# Patient Record
Sex: Female | Born: 1965
Health system: Southern US, Community
[De-identification: ages and names within clinical notes are randomized; demographics above are authoritative.]

## PROBLEM LIST (undated history)

## (undated) DIAGNOSIS — M549 Dorsalgia, unspecified: Secondary | ICD-10-CM

## (undated) DIAGNOSIS — M1990 Unspecified osteoarthritis, unspecified site: Secondary | ICD-10-CM

## (undated) DIAGNOSIS — F32A Depression, unspecified: Secondary | ICD-10-CM

## (undated) DIAGNOSIS — M479 Spondylosis, unspecified: Secondary | ICD-10-CM

## (undated) DIAGNOSIS — F988 Other specified behavioral and emotional disorders with onset usually occurring in childhood and adolescence: Secondary | ICD-10-CM

## (undated) DIAGNOSIS — I10 Essential (primary) hypertension: Secondary | ICD-10-CM

## (undated) DIAGNOSIS — J189 Pneumonia, unspecified organism: Secondary | ICD-10-CM

## (undated) DIAGNOSIS — F329 Major depressive disorder, single episode, unspecified: Secondary | ICD-10-CM

## (undated) DIAGNOSIS — G8929 Other chronic pain: Secondary | ICD-10-CM

## (undated) DIAGNOSIS — R569 Unspecified convulsions: Secondary | ICD-10-CM

## (undated) HISTORY — PX: APPENDECTOMY: SHX54

## (undated) HISTORY — PX: COLONOSCOPY W/ BIOPSIES AND POLYPECTOMY: SHX1376

## (undated) HISTORY — PX: CATARACT EXTRACTION W/ INTRAOCULAR LENS IMPLANT: SHX1309

## (undated) HISTORY — PX: HEMORRHOID SURGERY: SHX153

## (undated) HISTORY — PX: CHOLECYSTECTOMY: SHX55

## (undated) HISTORY — PX: TUBAL LIGATION: SHX77

---

## 2004-10-22 ENCOUNTER — Ambulatory Visit: Payer: Self-pay | Admitting: Psychiatry

## 2005-04-22 ENCOUNTER — Ambulatory Visit: Payer: Self-pay | Admitting: Psychiatry

## 2005-08-07 ENCOUNTER — Ambulatory Visit: Payer: Self-pay | Admitting: Psychiatry

## 2008-07-21 ENCOUNTER — Ambulatory Visit (HOSPITAL_COMMUNITY): Admission: RE | Admit: 2008-07-21 | Discharge: 2008-07-21 | Payer: Self-pay | Admitting: Family Medicine

## 2010-01-04 ENCOUNTER — Ambulatory Visit (HOSPITAL_COMMUNITY): Admission: RE | Admit: 2010-01-04 | Discharge: 2010-01-04 | Payer: Self-pay | Admitting: Family Medicine

## 2010-02-12 ENCOUNTER — Other Ambulatory Visit
Admission: RE | Admit: 2010-02-12 | Discharge: 2010-02-12 | Payer: Self-pay | Source: Home / Self Care | Admitting: Unknown Physician Specialty

## 2010-02-12 ENCOUNTER — Other Ambulatory Visit: Admission: RE | Admit: 2010-02-12 | Discharge: 2010-02-12 | Payer: Self-pay | Admitting: Unknown Physician Specialty

## 2010-12-02 ENCOUNTER — Encounter: Payer: Self-pay | Admitting: Family Medicine

## 2010-12-23 ENCOUNTER — Encounter: Payer: Self-pay | Admitting: Internal Medicine

## 2010-12-26 ENCOUNTER — Encounter: Payer: Self-pay | Admitting: Internal Medicine

## 2011-01-01 NOTE — Letter (Signed)
Summary: [facility] INSTRUCTIONS  [facility] INSTRUCTIONS   Imported By: Rexene Alberts 12/26/2010 11:03:35  _____________________________________________________________________  External Attachment:    Type:   Image     Comment:   External Document

## 2011-01-01 NOTE — Letter (Signed)
Summary: [facility] TRIAGE  [facility] TRIAGE   Imported By: Rexene Alberts 12/23/2010 11:58:17  _____________________________________________________________________  External Attachment:    Type:   Image     Comment:   External Document  Appended Document: [facility] TRIAGE ok as is  Appended Document: [facility] TRIAGE Instructions mailed.

## 2011-01-06 ENCOUNTER — Ambulatory Visit (HOSPITAL_COMMUNITY)
Admission: RE | Admit: 2011-01-06 | Discharge: 2011-01-06 | Disposition: A | Discharge status: Home / Self Care | Payer: Self-pay | Source: Ambulatory Visit | Attending: Internal Medicine | Admitting: Internal Medicine

## 2011-01-06 ENCOUNTER — Encounter: Payer: Self-pay | Admitting: Internal Medicine

## 2011-01-06 DIAGNOSIS — Z79899 Other long term (current) drug therapy: Secondary | ICD-10-CM | POA: Insufficient documentation

## 2011-01-06 DIAGNOSIS — Z860100 Personal history of colon polyps, unspecified: Secondary | ICD-10-CM | POA: Insufficient documentation

## 2011-01-06 DIAGNOSIS — K5730 Diverticulosis of large intestine without perforation or abscess without bleeding: Secondary | ICD-10-CM

## 2011-01-06 DIAGNOSIS — I10 Essential (primary) hypertension: Secondary | ICD-10-CM | POA: Insufficient documentation

## 2011-01-06 DIAGNOSIS — Z09 Encounter for follow-up examination after completed treatment for conditions other than malignant neoplasm: Secondary | ICD-10-CM

## 2011-01-15 NOTE — Op Note (Addendum)
  NAME:  JESSICAH, CROLL                ACCOUNT NO.:  [id number]  MEDICAL RECORD NO.:  [id number]           PATIENT TYPE:  O  LOCATION:  DAYP                          FACILITY:  [facility]  PHYSICIAN:  R. Roetta Sessions, M.D. DATE OF BIRTH:  07/27/1966  DATE OF PROCEDURE:  01/06/2011 DATE OF DISCHARGE:                              OPERATIVE REPORT   PROCEDURE:  Surveillance colonoscopy.  INDICATIONS FOR PROCEDURE:  A 45 year old lady with a history of colonic polyps.  Last colonoscopy is 5-6 years ago, done with Dr. Cleotis Nipper in [place].  Currently, no lower GI tract symptoms.  Colonoscopy is now being done as surveillance maneuver.  Risks, benefits, limitations, alternatives, imponderables have been reviewed, questions answered. Please see the documentation in the medical record.  PROCEDURE NOTE:  O2 saturation, blood pressure, pulse, respirations were monitored throughout the entire procedure.  CONSCIOUS SEDATION:  Versed 8 mg IV, Demerol 125 mg IV in divided doses.  INSTRUMENT:  Pentax video chip system.  FINDINGS:  Digital rectal exam revealed no abnormalities.  Endoscopic findings:  Prep was good.  Colon:  Colonic mucosa was surveyed from the rectosigmoid junction through the left transverse right colon to the appendiceal orifice, ileocecal valve/cecum.  These structures were well seen and photographed for record.  Terminal ileum was intubated to 5 cm. From this level, scope was slowly and cautiously withdrawn.  All previously mentioned mucosal surfaces were again seen.  The patient did not have sigmoid diverticulum.  Remainder of colonic mucosa and terminal ileal mucosa appeared normal.  Thorough examination of rectal mucosa including retroflexed view of the anal verge demonstrated no abnormalities.  The patient tolerated the procedure well.  Cecal withdrawal time 7 minutes.  IMPRESSION:  Normal rectum, sigmoid diverticulum, remainder colonic mucosa and terminal ileal mucosa  appeared normal.  RECOMMENDATIONS: 1. Diverticulosis literature provided to Ms. Milbrath. 2. Repeat colonoscopy 5 years.     Jonathon Bellows, M.D.     RMR/MEDQ  D:  01/06/2011  T:  01/06/2011  Job:  [id number]  cc:   [facility] Department  Electronically Signed by Lorrin Goodell M.D. on 01/14/2011 02:42:17 PM Electronically Signed by Lorrin Goodell M.D. on 01/14/2011 03:13:17 PM Electronically Signed by Lorrin Goodell M.D. on 01/14/2011 03:37:58 PM Electronically Signed by Lorrin Goodell M.D. on 01/14/2011 04:13:37 PM Electronically Signed by Lorrin Goodell M.D. on 01/14/2011 04:44:57 PM Electronically Signed by Lorrin Goodell M.D. on 01/14/2011 05:18:09 PM Electronically Signed by Lorrin Goodell M.D. on 01/14/2011 05:18:09 PM Electronically Signed by Lorrin Goodell M.D. on 01/14/2011 07:36:58 PM

## 2011-02-18 ENCOUNTER — Other Ambulatory Visit (HOSPITAL_COMMUNITY)
Admission: RE | Admit: 2011-02-18 | Discharge: 2011-02-18 | Disposition: A | Discharge status: Home / Self Care | Payer: PRIVATE HEALTH INSURANCE | Source: Ambulatory Visit | Attending: Nurse Practitioner | Admitting: Nurse Practitioner

## 2011-02-18 ENCOUNTER — Other Ambulatory Visit (HOSPITAL_COMMUNITY)
Admission: RE | Admit: 2011-02-18 | Discharge: 2011-02-18 | Disposition: A | Discharge status: Home / Self Care | Payer: PRIVATE HEALTH INSURANCE | Source: Ambulatory Visit | Attending: Unknown Physician Specialty | Admitting: Unknown Physician Specialty

## 2011-02-18 ENCOUNTER — Other Ambulatory Visit: Payer: Self-pay | Admitting: Nurse Practitioner

## 2011-02-18 DIAGNOSIS — R87612 Low grade squamous intraepithelial lesion on cytologic smear of cervix (LGSIL): Secondary | ICD-10-CM | POA: Insufficient documentation

## 2011-02-18 DIAGNOSIS — R87619 Unspecified abnormal cytological findings in specimens from cervix uteri: Secondary | ICD-10-CM | POA: Insufficient documentation

## 2013-02-08 ENCOUNTER — Emergency Department (HOSPITAL_COMMUNITY)
Admission: EM | Admit: 2013-02-08 | Discharge: 2013-02-09 | Disposition: A | Discharge status: Home / Self Care | Payer: PRIVATE HEALTH INSURANCE | Attending: Emergency Medicine | Admitting: Emergency Medicine

## 2013-02-08 ENCOUNTER — Encounter (HOSPITAL_COMMUNITY): Payer: Self-pay | Admitting: Emergency Medicine

## 2013-02-08 ENCOUNTER — Emergency Department (HOSPITAL_COMMUNITY): Payer: PRIVATE HEALTH INSURANCE

## 2013-02-08 DIAGNOSIS — Z8659 Personal history of other mental and behavioral disorders: Secondary | ICD-10-CM | POA: Insufficient documentation

## 2013-02-08 DIAGNOSIS — M545 Low back pain, unspecified: Secondary | ICD-10-CM | POA: Insufficient documentation

## 2013-02-08 DIAGNOSIS — F17200 Nicotine dependence, unspecified, uncomplicated: Secondary | ICD-10-CM | POA: Insufficient documentation

## 2013-02-08 DIAGNOSIS — M79609 Pain in unspecified limb: Secondary | ICD-10-CM | POA: Insufficient documentation

## 2013-02-08 DIAGNOSIS — R209 Unspecified disturbances of skin sensation: Secondary | ICD-10-CM | POA: Insufficient documentation

## 2013-02-08 DIAGNOSIS — M4310 Spondylolisthesis, site unspecified: Secondary | ICD-10-CM | POA: Insufficient documentation

## 2013-02-08 DIAGNOSIS — I10 Essential (primary) hypertension: Secondary | ICD-10-CM | POA: Insufficient documentation

## 2013-02-08 DIAGNOSIS — M4316 Spondylolisthesis, lumbar region: Secondary | ICD-10-CM

## 2013-02-08 DIAGNOSIS — M549 Dorsalgia, unspecified: Secondary | ICD-10-CM

## 2013-02-08 HISTORY — DX: Depression, unspecified: F32.A

## 2013-02-08 HISTORY — DX: Dorsalgia, unspecified: M54.9

## 2013-02-08 HISTORY — DX: Major depressive disorder, single episode, unspecified: F32.9

## 2013-02-08 HISTORY — DX: Essential (primary) hypertension: I10

## 2013-02-08 MED ORDER — KETOROLAC TROMETHAMINE 60 MG/2ML IM SOLN
60.0000 mg | Freq: Once | INTRAMUSCULAR | Status: AC
  Administered 2013-02-09: 60 mg via INTRAMUSCULAR
  Filled 2013-02-08: qty 2

## 2013-02-08 MED ORDER — CYCLOBENZAPRINE HCL 10 MG PO TABS
10.0000 mg | ORAL_TABLET | Freq: Once | ORAL | Status: DC
  Filled 2013-02-08: qty 1

## 2013-02-08 NOTE — ED Provider Notes (Signed)
History     CSN: [id number]  Arrival date & time 02/08/13  2247   First MD Initiated Contact with Patient 02/08/13 2313      Chief Complaint  Patient presents with  . Back Pain    (Consider location/radiation/quality/duration/timing/severity/associated sxs/prior treatment) HPI Comments: Patient with hx of low back pain for 8 months c/o increasing pain for several days.  Describes a burning, sharp , stinging pain to both legs that radiates from her thighs into her feet.  States she saw her PMD 3 weeks ago for this and was told that it was restless leg syndrome.  She was started on neurontin which she takes three times a day w/o relief.  She states the pain is worse at night and she is unable to sleep.  She also states that she has an appt with a rheumatologist in 2 weeks.  She denies injury, abd pain, shortness of breath, dysuria, incontinence of bowel or bladder, numbness or weakness of the LE's, or saddle anesthesia's.    Patient is a 47 y.o. female presenting with back pain. The history is provided by the patient.  Back Pain Location:  Lumbar spine Quality:  Burning and aching Radiates to:  L posterior upper leg, R posterior upper leg, L knee, R knee, L foot and R foot Pain severity:  Severe Pain is:  Worse during the night Onset quality:  Gradual Duration:  8 months Timing:  Constant Progression:  Worsening Chronicity:  Chronic Context: not emotional stress, not falling, not jumping from heights, not lifting heavy objects, not recent illness, not recent injury and not twisting   Relieved by:  Nothing Worsened by:  Lying down Ineffective treatments:  Ibuprofen (neurontin) Associated symptoms: leg pain and tingling   Associated symptoms: no abdominal pain, no abdominal swelling, no bladder incontinence, no bowel incontinence, no chest pain, no dysuria, no fever, no headaches, no numbness, no paresthesias, no pelvic pain, no perianal numbness and no weakness     Past Medical  History  Diagnosis Date  . Back pain   . Depression   . Hypertension     Past Surgical History  Procedure Laterality Date  . Hemorrhoid surgery      History reviewed. No pertinent family history.  History  Substance Use Topics  . Smoking status: Current Every Day Smoker  . Smokeless tobacco: Not on file  . Alcohol Use: No    OB History   Grav Para Term Preterm Abortions TAB SAB Ect Mult Living                  Review of Systems  Constitutional: Negative for fever, activity change and appetite change.  Respiratory: Negative for cough, chest tightness and shortness of breath.   Cardiovascular: Negative for chest pain.  Gastrointestinal: Negative for vomiting, abdominal pain, constipation, abdominal distention and bowel incontinence.  Genitourinary: Negative for bladder incontinence, dysuria, hematuria, flank pain, decreased urine volume, difficulty urinating and pelvic pain.       No perineal numbness or incontinence of urine or feces  Musculoskeletal: Positive for back pain. Negative for joint swelling and gait problem.  Skin: Negative for rash.  Neurological: Positive for tingling. Negative for dizziness, weakness, numbness, headaches and paresthesias.  All other systems reviewed and are negative.    Allergies  Review of patient's allergies indicates no known allergies.  Home Medications  No current outpatient prescriptions on file.  BP 170/108  Pulse 72  Temp(Src) 98.9 F (37.2 C) (Oral)  Resp 20  Ht 5\' 5"  (1.651 m)  Wt 200 lb (90.719 kg)  BMI 33.28 kg/m2  SpO2 99%  LMP 12/11/2012  Physical Exam  Nursing note and vitals reviewed. Constitutional: She is oriented to person, place, and time. She appears well-developed and well-nourished. No distress.  HENT:  Head: Normocephalic and atraumatic.  Neck: Normal range of motion. Neck supple.  Cardiovascular: Normal rate, regular rhythm, normal heart sounds and intact distal pulses.   No murmur  heard. Pulmonary/Chest: Effort normal and breath sounds normal. No respiratory distress.  Musculoskeletal: She exhibits tenderness. She exhibits no edema.       Lumbar back: She exhibits tenderness and pain. She exhibits normal range of motion, no swelling, no deformity, no laceration and normal pulse.       Back:  ttp of the lumbar spine and paraspinal muscles.  DP pulses are brisk and symmetrical , distal sensation intact.  No extremity edema.    Neurological: She is alert and oriented to person, place, and time. No cranial nerve deficit or sensory deficit. She exhibits normal muscle tone. Coordination and gait normal.  Reflex Scores:      Patellar reflexes are 2+ on the right side and 2+ on the left side.      Achilles reflexes are 2+ on the right side and 2+ on the left side. Skin: Skin is warm and dry.    ED Course  Procedures (including critical care time)  Labs Reviewed - No data to display No results found.   Dg Lumbar Spine Complete  02/09/2013  *RADIOLOGY REPORT*  Clinical Data: Low back pain with radiation into both lower extremities for 8 months.  LUMBAR SPINE - COMPLETE 4+ VIEW  Comparison: None.  Findings: Five lumbar type vertebrae.  Spondylolysis with mild spondylolisthesis at L4-5.  Mild degenerative changes in the upper lumbar interspaces with disc space narrowing and endplate hypertrophic changes.  Degenerative changes in the lumbar facet joints.  No vertebral compression deformities.  No focal bone lesion or bone destruction.  IMPRESSION: Spondylolysis and mild spondylolisthesis of L4-5.   Original Report Authenticated By: Burman Nieves, M.D.      MDM     Patient has ttp of the lumbar spine and paraspinal muscles.  No focal neuro deficits on exam.  Ambulates with a steady gait.  Doubt emergent neurological or infectious process.    On re-eval, pt is feeling better,  I have reviewed the x-ray results with the patient and she agrees to close f/u with her PMD this  week.  I will prescribe norco # 15, flexeril and naprosyn  The patient appears reasonably screened and/or stabilized for discharge and I doubt any other medical condition or other [facility] requiring further screening, evaluation, or treatment in the ED at this time prior to discharge.      Markham Dumlao L. Trisha Mangle, PA-C 02/09/13 1610

## 2013-02-08 NOTE — ED Notes (Signed)
Patient complaining of lower back pain radiating down bilateral legs x 8 months. States pain is worsening.

## 2013-02-09 MED ORDER — CYCLOBENZAPRINE HCL 10 MG PO TABS: 10.0000 mg | ORAL_TABLET | Freq: Three times a day (TID) | ORAL | Status: DC | PRN

## 2013-02-09 MED ORDER — NAPROXEN 500 MG PO TABS: 500.0000 mg | ORAL_TABLET | Freq: Two times a day (BID) | ORAL | Status: DC

## 2013-02-09 MED ORDER — HYDROCODONE-ACETAMINOPHEN 5-325 MG PO TABS: ORAL_TABLET | ORAL | Status: DC

## 2013-02-09 NOTE — ED Provider Notes (Signed)
Medical screening examination/treatment/procedure(s) were performed by non-physician practitioner and as supervising physician I was immediately available for consultation/collaboration.   Fredrik Mogel W Venisa Frampton, MD 02/09/13 0322 

## 2013-03-06 ENCOUNTER — Emergency Department (HOSPITAL_COMMUNITY): Payer: PRIVATE HEALTH INSURANCE

## 2013-03-06 ENCOUNTER — Emergency Department (HOSPITAL_COMMUNITY)
Admission: EM | Admit: 2013-03-06 | Discharge: 2013-03-06 | Disposition: A | Discharge status: Home / Self Care | Payer: PRIVATE HEALTH INSURANCE | Attending: Emergency Medicine | Admitting: Emergency Medicine

## 2013-03-06 ENCOUNTER — Encounter (HOSPITAL_COMMUNITY): Payer: Self-pay

## 2013-03-06 DIAGNOSIS — R059 Cough, unspecified: Secondary | ICD-10-CM | POA: Insufficient documentation

## 2013-03-06 DIAGNOSIS — R197 Diarrhea, unspecified: Secondary | ICD-10-CM | POA: Insufficient documentation

## 2013-03-06 DIAGNOSIS — R05 Cough: Secondary | ICD-10-CM | POA: Insufficient documentation

## 2013-03-06 DIAGNOSIS — J209 Acute bronchitis, unspecified: Secondary | ICD-10-CM | POA: Insufficient documentation

## 2013-03-06 DIAGNOSIS — F3289 Other specified depressive episodes: Secondary | ICD-10-CM | POA: Insufficient documentation

## 2013-03-06 DIAGNOSIS — R091 Pleurisy: Secondary | ICD-10-CM

## 2013-03-06 DIAGNOSIS — G8929 Other chronic pain: Secondary | ICD-10-CM | POA: Insufficient documentation

## 2013-03-06 DIAGNOSIS — M549 Dorsalgia, unspecified: Secondary | ICD-10-CM | POA: Insufficient documentation

## 2013-03-06 DIAGNOSIS — F17200 Nicotine dependence, unspecified, uncomplicated: Secondary | ICD-10-CM | POA: Insufficient documentation

## 2013-03-06 DIAGNOSIS — M25539 Pain in unspecified wrist: Secondary | ICD-10-CM | POA: Insufficient documentation

## 2013-03-06 DIAGNOSIS — F329 Major depressive disorder, single episode, unspecified: Secondary | ICD-10-CM | POA: Insufficient documentation

## 2013-03-06 DIAGNOSIS — I10 Essential (primary) hypertension: Secondary | ICD-10-CM | POA: Insufficient documentation

## 2013-03-06 DIAGNOSIS — J40 Bronchitis, not specified as acute or chronic: Secondary | ICD-10-CM

## 2013-03-06 DIAGNOSIS — R062 Wheezing: Secondary | ICD-10-CM | POA: Insufficient documentation

## 2013-03-06 LAB — CBC WITH DIFFERENTIAL/PLATELET
Basophils Absolute: 0 10*3/uL (ref 0.0–0.1)
Basophils Relative: 0 % (ref 0–1)
Eosinophils Absolute: 0.2 10*3/uL (ref 0.0–0.7)
Eosinophils Relative: 2 % (ref 0–5)
Hemoglobin: 14.2 g/dL (ref 12.0–15.0)
Lymphs Abs: 2.7 10*3/uL (ref 0.7–4.0)
MCHC: 35.5 g/dL (ref 30.0–36.0)
Monocytes Relative: 8 % (ref 3–12)
Neutro Abs: 6.2 10*3/uL (ref 1.7–7.7)

## 2013-03-06 LAB — BASIC METABOLIC PANEL
CO2: 25 mEq/L (ref 19–32)
Chloride: 100 mEq/L (ref 96–112)
GFR calc non Af Amer: >90 mL/min (ref >90)
Potassium: 3.7 mEq/L (ref 3.5–5.1)
Sodium: 135 mEq/L (ref 135–145)

## 2013-03-06 LAB — PRO B NATRIURETIC PEPTIDE: Pro B Natriuretic peptide (BNP): 60.2 pg/mL (ref 0–125)

## 2013-03-06 LAB — PROTIME-INR
INR: 0.92 (ref 0.00–1.49)
Prothrombin Time: 12.3 seconds (ref 11.6–15.2)

## 2013-03-06 LAB — D-DIMER, QUANTITATIVE: D-Dimer, Quant: 0.64 ug/mL-FEU — ABNORMAL HIGH (ref 0.00–0.48)

## 2013-03-06 MED ORDER — ONDANSETRON HCL 4 MG/2ML IJ SOLN
4.0000 mg | Freq: Once | INTRAMUSCULAR | Status: AC
  Administered 2013-03-06: 4 mg via INTRAVENOUS
  Filled 2013-03-06: qty 2

## 2013-03-06 MED ORDER — ASPIRIN 81 MG PO CHEW
324.0000 mg | CHEWABLE_TABLET | Freq: Once | ORAL | Status: AC
Start: 2013-03-06 — End: 2013-03-06
  Administered 2013-03-06: 324 mg via ORAL
  Filled 2013-03-06: qty 4

## 2013-03-06 MED ORDER — IOHEXOL 350 MG/ML SOLN
100.0000 mL | Freq: Once | INTRAVENOUS | Status: AC | PRN
  Administered 2013-03-06: 100 mL via INTRAVENOUS

## 2013-03-06 MED ORDER — LEVOFLOXACIN 750 MG PO TABS: 750.0000 mg | ORAL_TABLET | Freq: Every day | ORAL | Status: DC

## 2013-03-06 MED ORDER — PREDNISONE 50 MG PO TABS
60.0000 mg | ORAL_TABLET | Freq: Once | ORAL | Status: AC
  Administered 2013-03-06: 60 mg via ORAL
  Filled 2013-03-06: qty 1

## 2013-03-06 MED ORDER — KETOROLAC TROMETHAMINE 30 MG/ML IJ SOLN
30.0000 mg | Freq: Once | INTRAMUSCULAR | Status: AC
  Administered 2013-03-06: 30 mg via INTRAVENOUS
  Filled 2013-03-06: qty 1

## 2013-03-06 MED ORDER — ALBUTEROL SULFATE HFA 108 (90 BASE) MCG/ACT IN AERS
2.0000 | INHALATION_SPRAY | Freq: Once | RESPIRATORY_TRACT | Status: AC
  Administered 2013-03-06: 2 via RESPIRATORY_TRACT
  Filled 2013-03-06: qty 6.7

## 2013-03-06 MED ORDER — GI COCKTAIL ~~LOC~~
30.0000 mL | Freq: Once | ORAL | Status: AC
  Administered 2013-03-06: 30 mL via ORAL
  Filled 2013-03-06: qty 30

## 2013-03-06 MED ORDER — LEVOFLOXACIN 750 MG PO TABS
750.0000 mg | ORAL_TABLET | Freq: Once | ORAL | Status: AC
  Administered 2013-03-06: 750 mg via ORAL
  Filled 2013-03-06: qty 1

## 2013-03-06 MED ORDER — ALBUTEROL SULFATE (5 MG/ML) 0.5% IN NEBU
2.5000 mg | INHALATION_SOLUTION | Freq: Once | RESPIRATORY_TRACT | Status: AC
  Administered 2013-03-06: 2.5 mg via RESPIRATORY_TRACT
  Filled 2013-03-06: qty 0.5

## 2013-03-06 MED ORDER — NITROGLYCERIN 0.4 MG SL SUBL
0.4000 mg | SUBLINGUAL_TABLET | SUBLINGUAL | Status: DC | PRN
  Administered 2013-03-06: 0.4 mg via SUBLINGUAL
  Filled 2013-03-06: qty 25

## 2013-03-06 MED ORDER — MORPHINE SULFATE 4 MG/ML IJ SOLN
4.0000 mg | Freq: Once | INTRAMUSCULAR | Status: AC
  Administered 2013-03-06: 4 mg via INTRAVENOUS
  Filled 2013-03-06: qty 1

## 2013-03-06 MED ORDER — PREDNISONE 20 MG PO TABS: ORAL_TABLET | ORAL | Status: DC

## 2013-03-06 NOTE — ED Notes (Signed)
Complain of chest pain that radiates to right shoulder, arm and elbow. States pain is worse with cough

## 2013-03-06 NOTE — ED Provider Notes (Signed)
History    This chart was scribed for Gilda Crease, MD, by Frederik Pear, ED scribe. The patient was seen in room APA19/[facility]19 and the patient's care was started at 1740.    CSN: [id number]  Arrival date & time 03/06/13  1734   First MD Initiated Contact with Patient 03/06/13 1740      No chief complaint on file.   (Consider location/radiation/quality/duration/timing/severity/associated sxs/prior treatment) The history is provided by the patient and medical records. No language interpreter was used.   Jasmine Potter is a 47 y.o. female with a h/o of hypertension, depression and chronic back pain who presents to the Emergency Department complaining of sudden onset, gradually worsening, constant, burning central CP that radiates to her back and down her bilateral arms that is aggravated by coughing and began at 1300. She also complains of constant aching in her bilateral elbows that began after the CP started and wheezing that began at 16:30. She also complains of mild diarrhea that began yesterday, but has since resolved. She denies peripheral edema, fever, nausea, or emesis. She denies any h/o of DVT. She reports a h/o of cardiac disease in her dad at age 68.  Past Medical History  Diagnosis Date  . Back pain   . Depression   . Hypertension     Past Surgical History  Procedure Laterality Date  . Hemorrhoid surgery      No family history on file.  History  Substance Use Topics  . Smoking status: Current Every Day Smoker  . Smokeless tobacco: Not on file  . Alcohol Use: No    OB History   Grav Para Term Preterm Abortions TAB SAB Ect Mult Living                  Review of Systems  Respiratory: Positive for cough and wheezing.   Cardiovascular: Positive for chest pain.  Gastrointestinal: Negative for nausea, vomiting and diarrhea.  Musculoskeletal: Positive for arthralgias (elbows).  All other systems reviewed and are negative.    Allergies  Review of  patient's allergies indicates no known allergies.  Home Medications   Current Outpatient Rx  Name  Route  Sig  Dispense  Refill  . cyclobenzaprine (FLEXERIL) 10 MG tablet   Oral   Take 1 tablet (10 mg total) by mouth 3 (three) times daily as needed.   30 tablet   0   . HYDROcodone-acetaminophen (NORCO/VICODIN) 5-325 MG per tablet      Take one-two tabs po q 4-6 hrs prn pain   15 tablet   0   . naproxen (NAPROSYN) 500 MG tablet   Oral   Take 1 tablet (500 mg total) by mouth 2 (two) times daily with a meal.   20 tablet   0     BP 139/92  Pulse 92  Temp(Src) 98.2 F (36.8 C) (Oral)  Resp 20  Ht 5\' 5"  (1.651 m)  Wt 193 lb (87.544 kg)  BMI 32.12 kg/m2  SpO2 97%  LMP 12/11/2012  Physical Exam  Nursing note and vitals reviewed. Constitutional: She is oriented to person, place, and time. She appears well-developed and well-nourished. No distress.  HENT:  Head: Normocephalic and atraumatic.  Right Ear: Hearing normal.  Nose: Nose normal.  Mouth/Throat: Oropharynx is clear and moist and mucous membranes are normal.  Eyes: Conjunctivae and EOM are normal. Pupils are equal, round, and reactive to light.  Neck: Normal range of motion. Neck supple.  Cardiovascular: Normal rate, regular rhythm,  S1 normal and S2 normal.  Exam reveals no gallop and no friction rub.   No murmur heard. Pulmonary/Chest: Effort normal and breath sounds normal. No respiratory distress. She exhibits no tenderness.  Abdominal: Soft. Normal appearance and bowel sounds are normal. There is no hepatosplenomegaly. There is no tenderness. There is no rebound, no guarding, no tenderness at McBurney's point and negative Murphy's sign. No hernia.  Musculoskeletal: Normal range of motion.  Neurological: She is alert and oriented to person, place, and time. She has normal strength. No cranial nerve deficit or sensory deficit. Coordination normal. GCS eye subscore is 4. GCS verbal subscore is 5. GCS motor subscore  is 6.  Skin: Skin is warm, dry and intact. No rash noted. No cyanosis.  Psychiatric: She has a normal mood and affect. Her speech is normal and behavior is normal. Thought content normal.    ED Course  Procedures (including critical care time)  EKG:  Date: 03/06/2013  Rate: 91  Rhythm: normal sinus rhythm  QRS Axis: normal  Intervals: normal  ST/T Wave abnormalities: nonspecific T wave changes  Conduction Disutrbances:none  Narrative Interpretation:   Old EKG Reviewed: none available  DIAGNOSTIC STUDIES: Oxygen Saturation is 97% on room air, adequate by my interpretation.    COORDINATION OF CARE:  17:40- Discussed planned course of treatment with the patient, including a chest X-ray, chewable aspirin, Nitrostat, blood work, and EKG, who is agreeable at this time.  17:51- Medication Orders- nitroglycerin (nitrostat) SL tablet 0.4 mg- every 5 minutes.  18:00- Medication Orders- aspirin chewable tablet 324 mg- once.  19:13- Ordered CT angio chest.  19:15- Medication Orders- gi cocktail (Maalox, Lidocaine, Donnatal)- once.  20:45- Medication Orders- ketorolac (toradol) 30 mg/ml injection 30 mg- once, ondansetron (zofran) injection 4 mg- once, morphine 4 mg/ml injection 4 mg- once.  21:15- Medication Orders- albuterol (proventil) (5mg /ml) 0.5% nebulizer solution 2.5 mg- once.  Labs Reviewed  BASIC METABOLIC PANEL - Abnormal; Notable for the following:    Glucose, Bld 105 (*)    Creatinine, Ser 0.47 (*)    All other components within normal limits  D-DIMER, QUANTITATIVE - Abnormal; Notable for the following:    D-Dimer, Quant 0.64 (*)    All other components within normal limits  CBC WITH DIFFERENTIAL  TROPONIN I  PROTIME-INR  PRO B NATRIURETIC PEPTIDE   Ct Angio Chest Pe W/cm &/or Wo Cm  03/06/2013  *RADIOLOGY REPORT*  Clinical Data: Chest pain.  Short of breath.  Elevated D-dimer. Pulmonary embolism.  CT ANGIOGRAPHY CHEST  Technique:  Multidetector CT imaging of the  chest using the standard protocol during bolus administration of intravenous contrast. Multiplanar reconstructed images including MIPs were obtained and reviewed to evaluate the vascular anatomy.  Contrast: OMNIPAQUE IOHEXOL 350 MG/ML SOLN  Comparison: None.  Findings: Consider further evaluation with thyroid ultrasound.  If patient is clinically hyperthyroid, consider nuclear medicine thyroid uptake and scan. Bolus dispersion is present on the examination, precluding evaluation of subsegmental vessels in the upper lobes.  There is no axillary adenopathy.  Borderline precarinal lymph node is present.  No pericardial effusion.  Mild intrahepatic biliary ductal dilation is incidentally noted in the upper abdomen, probably secondary to prior cholecystectomy. Clinically correlate.  There is no pulmonary embolism identified. No acute aortic abnormality.  No pericardial or pleural effusion. Trachea appears within normal limits.  There is mural thickening of the right lower lobe bronchi and filling of the posterior basal segment bronchi in the right lower lobe.  Bronchial wall  thickening is also present in the left lower lobe.  Small focus of lingular consolidation is present which may represent atelectasis or scarring.  No aggressive osseous lesions are identified.  Prominent right hilar nodal tissue is probably reactive.  IMPRESSION: 1.  No pulmonary embolus.  Suboptimal evaluation of segmental and smaller pulmonary vessels. 2.  Diffuse lower lobe bronchial wall thickening.  Suspect smoking related lung disease.  Mucoid impaction and right lower lobe segmental airways. 3.  Small focus of collapse / consolidation in the lingula which may represent infection, atelectasis or scarring.  Similar changes in the medial right middle lobe. 4.  Borderline mediastinal and right hilar lymph nodes are probably reactive. 5.  Left thyroid lobe nodule. Consider further evaluation with thyroid ultrasound.  If patient is clinically  hyperthyroid, consider nuclear medicine thyroid uptake and scan.   Original Report Authenticated By: Andreas Newport, M.D.    Dg Chest Port 1 View  03/06/2013  *RADIOLOGY REPORT*  Clinical Data: Chest pain.  Short of breath.  Burning sensation.  PORTABLE CHEST - 1 VIEW  Comparison: None.  Findings: Healed right rib fractures are present.  Fullness of the right hilum of unknown chronicity.  The lung volumes are reduced. There is no airspace disease.  No pleural effusion. Monitoring leads are projected over the chest.  The cardiopericardial silhouette is within normal limits for projection.  Consider repeat PA and lateral chest radiograph with full inspiration when patient condition permits for reevaluation of the right hilum.  IMPRESSION: No acute cardiopulmonary disease.  Fullness of the right hilum. See discussion above.   Original Report Authenticated By: Andreas Newport, M.D.      Diagnosis:1.  Bronchitis with pleurisy, can't rule out lingular pneumonia 2. Thyroid nodule    MDM  She presents to the ER with complaints of pain in the center of her chest. She reports that the pain is a burning pain is significantly worsens when she coughs. She has had progressively worsening cough associated with the symptoms. Chest fever. Patient does family history of heart disease, but it is felt that the symptoms are very atypical. Her EKG was negative. Troponin was negative. This was after 5-6 hours for pain. It is unlikely that this is acute coronary syndrome. She had no improvement of her pain with Pitocin. She has not had any exertional component.  She has persistent cough. ER and has significant pain when she coughs. Chest x-ray did not show obvious abnormality. Patient's d-dimer was slightly elevated and therefore CT angiography was performed. No obvious pulmonary embolus was seen. There is mucoid impaction in the right lower lobe and possible lingular pneumonia. I feel that this explains the patient's current  symptoms. She tells me that she is living with her sister and her sister had similar symptoms last week, and was started on a nebulizer and prednisone. She administered Toradol and morphine with resolution of a burning sensation. She was still having some pain with her cough, however. Patient administered an albuterol nebulizer and had further improvement.   I personally performed the services described in this documentation, which was scribed in my presence. The recorded information has been reviewed and is accurate.       Gilda Crease, MD 03/06/13 2151

## 2013-03-06 NOTE — ED Notes (Signed)
At this time pt states chest pain is unchanged at this time, right sided and states it is a burning pain. Worsened with a deep breath, states she does not want to try another nitro tab and this time.

## 2013-03-06 NOTE — ED Notes (Signed)
Dr Blinda Leatherwood aware that pts pain unchanged with nitro, put in order for GI cocktail

## 2013-03-06 NOTE — ED Notes (Signed)
Pt still states pain present, dr Blinda Leatherwood notified

## 2013-03-06 NOTE — ED Notes (Signed)
Pt states slight relief with GI cocktail

## 2013-03-06 NOTE — ED Notes (Signed)
resp therapy contacted for breathing treatment.

## 2013-03-06 NOTE — ED Notes (Signed)
Pt complain of burning to chest. Request pain medication. EDP aware

## 2013-03-28 ENCOUNTER — Other Ambulatory Visit (HOSPITAL_COMMUNITY): Payer: Self-pay | Admitting: "Endocrinology

## 2013-03-28 DIAGNOSIS — E049 Nontoxic goiter, unspecified: Secondary | ICD-10-CM

## 2013-03-29 ENCOUNTER — Other Ambulatory Visit (HOSPITAL_COMMUNITY): Payer: PRIVATE HEALTH INSURANCE

## 2013-03-29 ENCOUNTER — Ambulatory Visit (HOSPITAL_COMMUNITY): Payer: PRIVATE HEALTH INSURANCE

## 2013-04-05 ENCOUNTER — Ambulatory Visit (HOSPITAL_COMMUNITY)
Admission: RE | Admit: 2013-04-05 | Discharge: 2013-04-05 | Disposition: A | Discharge status: Home / Self Care | Payer: PRIVATE HEALTH INSURANCE | Source: Ambulatory Visit | Attending: "Endocrinology | Admitting: "Endocrinology

## 2013-04-05 DIAGNOSIS — E041 Nontoxic single thyroid nodule: Secondary | ICD-10-CM | POA: Insufficient documentation

## 2013-04-05 DIAGNOSIS — E049 Nontoxic goiter, unspecified: Secondary | ICD-10-CM

## 2013-04-08 ENCOUNTER — Other Ambulatory Visit (HOSPITAL_COMMUNITY): Payer: Self-pay | Admitting: "Endocrinology

## 2013-04-08 DIAGNOSIS — E042 Nontoxic multinodular goiter: Secondary | ICD-10-CM

## 2013-04-14 ENCOUNTER — Ambulatory Visit (HOSPITAL_COMMUNITY)
Admission: RE | Admit: 2013-04-14 | Discharge: 2013-04-14 | Disposition: A | Discharge status: Home / Self Care | Payer: PRIVATE HEALTH INSURANCE | Source: Ambulatory Visit | Attending: "Endocrinology | Admitting: "Endocrinology

## 2013-04-14 ENCOUNTER — Other Ambulatory Visit (HOSPITAL_COMMUNITY): Payer: Self-pay | Admitting: "Endocrinology

## 2013-04-14 DIAGNOSIS — E042 Nontoxic multinodular goiter: Secondary | ICD-10-CM

## 2013-04-14 MED ORDER — LIDOCAINE HCL (PF) 2 % IJ SOLN
INTRAMUSCULAR | Status: AC
  Filled 2013-04-14: qty 10

## 2013-04-14 NOTE — Progress Notes (Signed)
Lidocaine 2%            4mL injected                 left thyroid biopsy performed

## 2013-05-27 ENCOUNTER — Other Ambulatory Visit (HOSPITAL_COMMUNITY): Payer: Self-pay | Admitting: Physician Assistant

## 2013-05-27 DIAGNOSIS — Z139 Encounter for screening, unspecified: Secondary | ICD-10-CM

## 2013-05-31 ENCOUNTER — Inpatient Hospital Stay (HOSPITAL_COMMUNITY): Admission: RE | Admit: 2013-05-31 | Payer: PRIVATE HEALTH INSURANCE | Source: Ambulatory Visit

## 2013-07-19 ENCOUNTER — Ambulatory Visit (HOSPITAL_COMMUNITY): Payer: PRIVATE HEALTH INSURANCE

## 2013-08-11 ENCOUNTER — Ambulatory Visit (HOSPITAL_COMMUNITY)
Admission: RE | Admit: 2013-08-11 | Discharge: 2013-08-11 | Disposition: A | Discharge status: Home / Self Care | Payer: PRIVATE HEALTH INSURANCE | Source: Ambulatory Visit | Attending: Physician Assistant | Admitting: Physician Assistant

## 2013-08-11 DIAGNOSIS — Z139 Encounter for screening, unspecified: Secondary | ICD-10-CM

## 2013-08-11 DIAGNOSIS — Z1231 Encounter for screening mammogram for malignant neoplasm of breast: Secondary | ICD-10-CM | POA: Insufficient documentation

## 2013-09-25 ENCOUNTER — Encounter (HOSPITAL_COMMUNITY): Payer: Self-pay | Admitting: Emergency Medicine

## 2013-09-25 ENCOUNTER — Emergency Department (HOSPITAL_COMMUNITY): Payer: PRIVATE HEALTH INSURANCE

## 2013-09-25 ENCOUNTER — Emergency Department (HOSPITAL_COMMUNITY)
Admission: EM | Admit: 2013-09-25 | Discharge: 2013-09-25 | Disposition: A | Discharge status: Home / Self Care | Payer: PRIVATE HEALTH INSURANCE | Attending: Emergency Medicine | Admitting: Emergency Medicine

## 2013-09-25 DIAGNOSIS — M545 Low back pain, unspecified: Secondary | ICD-10-CM | POA: Insufficient documentation

## 2013-09-25 DIAGNOSIS — F329 Major depressive disorder, single episode, unspecified: Secondary | ICD-10-CM | POA: Insufficient documentation

## 2013-09-25 DIAGNOSIS — F17200 Nicotine dependence, unspecified, uncomplicated: Secondary | ICD-10-CM | POA: Insufficient documentation

## 2013-09-25 DIAGNOSIS — G8929 Other chronic pain: Secondary | ICD-10-CM | POA: Insufficient documentation

## 2013-09-25 DIAGNOSIS — I10 Essential (primary) hypertension: Secondary | ICD-10-CM | POA: Insufficient documentation

## 2013-09-25 DIAGNOSIS — Z79899 Other long term (current) drug therapy: Secondary | ICD-10-CM | POA: Insufficient documentation

## 2013-09-25 DIAGNOSIS — F3289 Other specified depressive episodes: Secondary | ICD-10-CM | POA: Insufficient documentation

## 2013-09-25 DIAGNOSIS — G510 Bell's palsy: Secondary | ICD-10-CM | POA: Insufficient documentation

## 2013-09-25 LAB — COMPREHENSIVE METABOLIC PANEL
ALT: 17 U/L (ref 0–35)
AST: 21 U/L (ref 0–37)
Alkaline Phosphatase: 61 U/L (ref 39–117)
CO2: 26 mEq/L (ref 19–32)
Calcium: 10.1 mg/dL (ref 8.4–10.5)
Chloride: 102 mEq/L (ref 96–112)
GFR calc Af Amer: >90 mL/min (ref >90)
GFR calc non Af Amer: >90 mL/min (ref >90)
Glucose, Bld: 102 mg/dL — ABNORMAL HIGH (ref 70–99)
Sodium: 137 mEq/L (ref 135–145)
Total Bilirubin: 0.3 mg/dL (ref 0.3–1.2)

## 2013-09-25 LAB — CBC WITH DIFFERENTIAL/PLATELET
Basophils Absolute: 0 10*3/uL (ref 0.0–0.1)
Eosinophils Relative: 1 % (ref 0–5)
HCT: 40.6 % (ref 36.0–46.0)
Lymphocytes Relative: 44 % (ref 12–46)
Lymphs Abs: 3.9 10*3/uL (ref 0.7–4.0)
MCV: 91 fL (ref 78.0–100.0)
Neutro Abs: 4.2 10*3/uL (ref 1.7–7.7)
Platelets: 259 10*3/uL (ref 150–400)
RBC: 4.46 MIL/uL (ref 3.87–5.11)
WBC: 8.9 10*3/uL (ref 4.0–10.5)

## 2013-09-25 MED ORDER — VALACYCLOVIR HCL 1 G PO TABS: 1000.0000 mg | ORAL_TABLET | Freq: Three times a day (TID) | ORAL | Status: DC

## 2013-09-25 MED ORDER — ARTIFICIAL TEARS OP OINT: TOPICAL_OINTMENT | OPHTHALMIC | Status: DC | PRN

## 2013-09-25 MED ORDER — PREDNISONE 20 MG PO TABS: 60.0000 mg | ORAL_TABLET | Freq: Every day | ORAL | Status: DC

## 2013-09-25 MED ORDER — PREDNISONE 50 MG PO TABS
60.0000 mg | ORAL_TABLET | Freq: Once | ORAL | Status: AC
  Administered 2013-09-25: 60 mg via ORAL
  Filled 2013-09-25 (×2): qty 1

## 2013-09-25 MED ORDER — IOHEXOL 350 MG/ML SOLN
100.0000 mL | Freq: Once | INTRAVENOUS | Status: AC | PRN
  Administered 2013-09-25: 100 mL via INTRAVENOUS

## 2013-09-25 MED ORDER — VALACYCLOVIR HCL 500 MG PO TABS
1000.0000 mg | ORAL_TABLET | Freq: Once | ORAL | Status: AC
  Administered 2013-09-25: 1000 mg via ORAL
  Filled 2013-09-25: qty 2

## 2013-09-25 NOTE — ED Notes (Signed)
Pt reports pain in left side of neck and left side of face.  Says feels like has no control of muscles in left side of face.  No extremity numbness or weakness.  Left sided facial droop as well.

## 2013-09-25 NOTE — ED Notes (Signed)
Facial palsy noted to left side. Patient unable to completely close left eye. Patient with not other deficits.

## 2013-09-25 NOTE — ED Provider Notes (Signed)
CSN: [id number]     Arrival date & time 09/25/13  1333 History  This chart was scribed for Glynn Octave, MD by Bennett Scrape, ED Scribe. This patient was seen in room APA12/APA12 and the patient's care was started at 1:55 PM.   Chief Complaint  Patient presents with  . Facial Pain    The history is provided by the patient. No language interpreter was used.    HPI Comments: Jasmine Potter is a 47 y.o. female who presents to the Emergency Department complaining of weakness to the left face described as feeling like she has no control over the left facial muscles with an associated inability to completely close the left eye and a left-sided facial droop. Pt states that she had several episodes of brief left-sided neck pains that radiated into the face for the past 2 days. She states that this morning she noted the weakness with a left sided facial droop. She denies having any facial pain currently. She denies any weakness or numbness in her extremities, CP or SOB. She denies any aphasia or troubles swallowing. She denies any recent camping trips or known tick bites. She also reports a h/o DDD with chronic lower back pain that she states has been radiating into her posterior left thigh. She states that she plans to f/u with her PCP for the symptoms. She denies having a h/o DM. Denies any tick bites  PCP is Dr. Loreta Ave  Past Medical History  Diagnosis Date  . Back pain   . Depression   . Hypertension    Past Surgical History  Procedure Laterality Date  . Hemorrhoid surgery     No family history on file. History  Substance Use Topics  . Smoking status: Current Every Day Smoker  . Smokeless tobacco: Not on file  . Alcohol Use: No   No OB history provided.  Review of Systems  A complete 10 system review of systems was obtained and all systems are negative except as noted in the HPI and PMH.   Allergies  Review of patient's allergies indicates no known allergies.  Home Medications    Current Outpatient Rx  Name  Route  Sig  Dispense  Refill  . Ascorbic Acid (VITAMIN C PO)   Oral   Take 1 tablet by mouth daily.         Marland Kitchen docusate sodium (COLACE) 100 MG capsule   Oral   Take 100 mg by mouth daily as needed for mild constipation.         . fish oil-omega-3 fatty acids 1000 MG capsule   Oral   Take 2 g by mouth 2 (two) times daily.         Marland Kitchen FLUoxetine (PROZAC) 20 MG capsule   Oral   Take 60 mg by mouth daily.          Marland Kitchen oxyCODONE-acetaminophen (PERCOCET) 10-325 MG per tablet   Oral   Take 1 tablet by mouth every 4 (four) hours as needed for pain.         . phentermine 37.5 MG capsule   Oral   Take 37.5 mg by mouth daily.         Marland Kitchen artificial tears (LACRILUBE) OINT ophthalmic ointment   Both Eyes   Place into both eyes every 4 (four) hours as needed for dry eyes. Apply to l eye as needed   3.5 g   0   . predniSONE (DELTASONE) 20 MG tablet   Oral  Take 3 tablets (60 mg total) by mouth daily with breakfast.   21 tablet   0   . valACYclovir (VALTREX) 1000 MG tablet   Oral   Take 1 tablet (1,000 mg total) by mouth 3 (three) times daily.   21 tablet   0    Triage Vitals: BP 179/98  Pulse 96  Temp(Src) 98.2 F (36.8 C) (Oral)  Resp 18  Ht 5\' 5"  (1.651 m)  Wt 210 lb (95.255 kg)  BMI 34.95 kg/m2  SpO2 100%  Physical Exam  Nursing note and vitals reviewed. Constitutional: She is oriented to person, place, and time. She appears well-developed and well-nourished. No distress.  HENT:  Head: Normocephalic and atraumatic.  Mouth/Throat: Oropharynx is clear and moist. No oropharyngeal exudate.  Left molar is decayed and non-tender to palpation.  No drainable abscess.  Eyes: Conjunctivae and EOM are normal. Pupils are equal, round, and reactive to light.  Neck: Normal range of motion. Neck supple. No tracheal deviation present.  No carotid bruits  Cardiovascular: Normal rate and regular rhythm.   Pulmonary/Chest: Effort normal and  breath sounds normal. No respiratory distress.  Abdominal: Soft. There is no tenderness. There is no rebound and no guarding.  Musculoskeletal: Normal range of motion.  Neurological: She is alert and oriented to person, place, and time. A cranial nerve deficit is present. She exhibits normal muscle tone. Coordination normal.  L sided facial droop, no ataxia on finger to nose, no nystagmus, 5/5 strength throughout, no pronator drift, Romberg negative, normal gait.  Left-sided facial droop. Asymmetric smile. Unable to close left eye completely. Unable to raise left forehead. Tongue is midline.    Skin: Skin is warm and dry.  Psychiatric: She has a normal mood and affect. Her behavior is normal.    ED Course  Procedures (including critical care time)  Medications  predniSONE (DELTASONE) tablet 60 mg (60 mg Oral Given 09/25/13 1410)  valACYclovir (VALTREX) tablet 1,000 mg (1,000 mg Oral Given 09/25/13 1410)  iohexol (OMNIPAQUE) 350 MG/ML injection 100 mL (100 mLs Intravenous Contrast Given 09/25/13 1619)    DIAGNOSTIC STUDIES: Oxygen Saturation is 100% on room air, normal by my interpretation.    COORDINATION OF CARE: 1:59 PM-Advised pt that she most likely has Bell's Palsy which can be treated with antivirals. Discussed treatment plan which includes CT of head to rule out an infarct with pt at bedside and pt agreed to plan. Pt declined   2:23 PM-Pt rechecked and now reports neck pain. She is still unable to completely close the left eye. Still declines pain medication.  Labs Review Labs Reviewed  COMPREHENSIVE METABOLIC PANEL - Abnormal; Notable for the following:    Glucose, Bld 102 (*)    All other components within normal limits  CBC WITH DIFFERENTIAL   Imaging Review Ct Head Wo Contrast   09/25/2013   CLINICAL DATA:  Left-sided facial drooping this morning.  EXAM: CT HEAD WITHOUT CONTRAST  TECHNIQUE: Contiguous axial images were obtained from the base of the skull through the  vertex without intravenous contrast.  COMPARISON:  None.  FINDINGS: Ventricles are normal in size and configuration. There are no parenchymal masses or mass effect. There are no areas of abnormal parenchymal attenuation. There is no evidence of an infarct. No extra-axial masses or abnormal fluid collections.  No intracranial hemorrhage.  Visualized sinuses and mastoid air cells are clear.  IMPRESSION: Normal unenhanced CT scan of the brain.   Electronically Signed   By: Amie Portland  M.D.   On: 09/25/2013 14:20    EKG Interpretation   None       MDM   1. Bell's palsy    Left-sided facial weakness since this morning. She was last seen normal last night. Denies any difficulty speaking, swallowing, breathing. No extremity weakness or numbness.  Exam appears to be consistent with Bell's palsy. Unable to close left eye. Unable to raise left eyebrow. Drooping of left corner of mouth. Remainder of neurological exam is normal. Doubt CVA.  This patient was describing some intermittent pains in the side of her neck we'll obtain CT angiogram to evaluate remote possibility of carotid dissection.  CT angiogram of her neck and head is negative. We'll treat for Bell's palsy with steroids, valtrex, and eye lubrication. Followup with her Dr. this week. Return precautions discussed.  I personally performed the services described in this documentation, which was scribed in my presence. The recorded information has been reviewed and is accurate.     Glynn Octave, MD 09/25/13 [id number]

## 2013-10-20 ENCOUNTER — Ambulatory Visit (HOSPITAL_COMMUNITY)
Admission: RE | Admit: 2013-10-20 | Discharge: 2013-10-20 | Disposition: A | Discharge status: Home / Self Care | Payer: PRIVATE HEALTH INSURANCE | Source: Ambulatory Visit | Attending: Family Medicine | Admitting: Family Medicine

## 2013-10-20 ENCOUNTER — Other Ambulatory Visit (HOSPITAL_COMMUNITY): Payer: Self-pay | Admitting: Family Medicine

## 2013-10-20 DIAGNOSIS — R109 Unspecified abdominal pain: Secondary | ICD-10-CM | POA: Insufficient documentation

## 2013-10-20 DIAGNOSIS — M479 Spondylosis, unspecified: Secondary | ICD-10-CM | POA: Insufficient documentation

## 2013-10-20 DIAGNOSIS — K429 Umbilical hernia without obstruction or gangrene: Secondary | ICD-10-CM | POA: Insufficient documentation

## 2013-10-20 DIAGNOSIS — R300 Dysuria: Secondary | ICD-10-CM | POA: Insufficient documentation

## 2013-10-20 DIAGNOSIS — K838 Other specified diseases of biliary tract: Secondary | ICD-10-CM | POA: Insufficient documentation

## 2013-10-20 DIAGNOSIS — M549 Dorsalgia, unspecified: Secondary | ICD-10-CM | POA: Insufficient documentation

## 2013-10-20 DIAGNOSIS — I10 Essential (primary) hypertension: Secondary | ICD-10-CM | POA: Insufficient documentation

## 2013-10-20 MED ORDER — IOHEXOL 300 MG/ML  SOLN
100.0000 mL | Freq: Once | INTRAMUSCULAR | Status: AC | PRN
  Administered 2013-10-20: 100 mL via INTRAVENOUS

## 2014-08-01 ENCOUNTER — Other Ambulatory Visit (HOSPITAL_COMMUNITY): Payer: Self-pay | Admitting: Physician Assistant

## 2014-08-01 DIAGNOSIS — Z139 Encounter for screening, unspecified: Secondary | ICD-10-CM

## 2014-08-04 ENCOUNTER — Ambulatory Visit (HOSPITAL_COMMUNITY): Payer: PRIVATE HEALTH INSURANCE

## 2015-01-04 ENCOUNTER — Other Ambulatory Visit (HOSPITAL_COMMUNITY): Payer: Self-pay | Admitting: Family Medicine

## 2015-01-04 DIAGNOSIS — Z139 Encounter for screening, unspecified: Secondary | ICD-10-CM

## 2015-01-08 ENCOUNTER — Ambulatory Visit (HOSPITAL_COMMUNITY): Payer: PRIVATE HEALTH INSURANCE

## 2015-01-10 ENCOUNTER — Other Ambulatory Visit (HOSPITAL_COMMUNITY): Payer: Self-pay | Admitting: Physician Assistant

## 2015-01-10 DIAGNOSIS — N6320 Unspecified lump in the left breast, unspecified quadrant: Secondary | ICD-10-CM

## 2015-01-23 ENCOUNTER — Ambulatory Visit (HOSPITAL_COMMUNITY)
Admission: RE | Admit: 2015-01-23 | Discharge: 2015-01-23 | Disposition: A | Discharge status: Home / Self Care | Payer: PRIVATE HEALTH INSURANCE | Source: Ambulatory Visit | Attending: Physician Assistant | Admitting: Physician Assistant

## 2015-01-23 ENCOUNTER — Other Ambulatory Visit (HOSPITAL_COMMUNITY): Payer: Self-pay | Admitting: Physician Assistant

## 2015-01-23 DIAGNOSIS — Z1231 Encounter for screening mammogram for malignant neoplasm of breast: Secondary | ICD-10-CM | POA: Insufficient documentation

## 2015-01-23 DIAGNOSIS — Z1239 Encounter for other screening for malignant neoplasm of breast: Secondary | ICD-10-CM

## 2015-01-23 DIAGNOSIS — N63 Unspecified lump in breast: Secondary | ICD-10-CM | POA: Insufficient documentation

## 2015-01-23 DIAGNOSIS — N6320 Unspecified lump in the left breast, unspecified quadrant: Secondary | ICD-10-CM

## 2015-01-24 ENCOUNTER — Telehealth: Payer: Self-pay | Admitting: General Practice

## 2015-01-24 NOTE — Telephone Encounter (Signed)
LMOM to call.

## 2015-01-24 NOTE — Telephone Encounter (Signed)
Please call patient to schedule her tcs. She can be reached at [phone] after 2 pm.  Routing to [facility]

## 2015-01-31 NOTE — Telephone Encounter (Signed)
I called pt. She had her last colonoscopy in 12/21/2014 by Dr. Gala Romney and he recommended next in 5 years. She is on recall for 12/2015. She has a hx of colonic polyps.  She is not having any problems. She does have a family hx of colon cancer in her paternal grandfather. She would still be due in 5 years. I am routing to Laban Emperor, NP to advise if any different.

## 2015-02-01 NOTE — Telephone Encounter (Signed)
Last colonoscopy 2012. Yes, next in 2017. Agree.

## 2015-02-01 NOTE — Telephone Encounter (Signed)
Called and LMOM that she is not due til 12/2015. If she has problems before then, please call.  We will have her on recall for 12/2015. Letter sent to PCP.  Stacy, please nic for 12/2015.

## 2015-02-26 ENCOUNTER — Ambulatory Visit: Payer: PRIVATE HEALTH INSURANCE | Admitting: Nurse Practitioner

## 2015-12-03 ENCOUNTER — Encounter: Payer: Self-pay | Admitting: Internal Medicine

## 2015-12-18 ENCOUNTER — Telehealth: Payer: Self-pay

## 2015-12-18 NOTE — Telephone Encounter (Signed)
[phone]  PATIENT RECEIVED LETTER TO SCHEDULE TCS

## 2015-12-18 NOTE — Telephone Encounter (Signed)
Pt was on recall for colonoscopy. Her last one in 2012 and next now due. She has a hx of colon polyps.  Ov with Walden Field, NP on 01/01/2016 at 2:00 PM.

## 2016-01-01 ENCOUNTER — Ambulatory Visit: Payer: PRIVATE HEALTH INSURANCE | Admitting: Nurse Practitioner

## 2016-01-01 ENCOUNTER — Encounter: Payer: Self-pay | Admitting: Nurse Practitioner

## 2016-01-01 ENCOUNTER — Telehealth: Payer: Self-pay | Admitting: Nurse Practitioner

## 2016-01-01 NOTE — Telephone Encounter (Signed)
Noted  

## 2016-01-01 NOTE — Telephone Encounter (Signed)
PATIENT WAS A NO SHOW AND LETTER SENT  °

## 2016-01-28 ENCOUNTER — Ambulatory Visit (HOSPITAL_COMMUNITY)
Admission: RE | Admit: 2016-01-28 | Discharge: 2016-01-28 | Disposition: A | Discharge status: Home / Self Care | Payer: PRIVATE HEALTH INSURANCE | Source: Ambulatory Visit | Attending: Physician Assistant | Admitting: Physician Assistant

## 2016-01-28 ENCOUNTER — Other Ambulatory Visit (HOSPITAL_COMMUNITY): Payer: Self-pay | Admitting: Physician Assistant

## 2016-01-28 DIAGNOSIS — M25561 Pain in right knee: Secondary | ICD-10-CM | POA: Diagnosis present

## 2016-01-28 DIAGNOSIS — G894 Chronic pain syndrome: Secondary | ICD-10-CM

## 2016-02-25 ENCOUNTER — Telehealth: Payer: Self-pay

## 2016-02-25 NOTE — Telephone Encounter (Signed)
Pt called to set up colonoscopy. I told her the triage nurse does triages between 130-430, She can be reached at [phone]

## 2016-02-27 ENCOUNTER — Encounter: Payer: Self-pay | Admitting: Nurse Practitioner

## 2016-02-27 NOTE — Telephone Encounter (Signed)
Pt missed her appointment with Walden Field, NP on 01/01/2016. She has a hx of polyps and was supposed to come in for an OV prior to scheduling a colonoscopy.  Can you call and schedule the OV appt for her Jasmine Potter?  Thanks!

## 2016-02-27 NOTE — Telephone Encounter (Signed)
OV made and letter mailed °

## 2016-03-03 NOTE — Telephone Encounter (Signed)
Pt called and is aware of the OV appt on 03/25/2016 at 10:00 AM with Walden Field, NP.

## 2016-03-14 ENCOUNTER — Ambulatory Visit (HOSPITAL_COMMUNITY)
Admission: RE | Admit: 2016-03-14 | Discharge: 2016-03-14 | Disposition: A | Discharge status: Home / Self Care | Payer: PRIVATE HEALTH INSURANCE | Source: Ambulatory Visit | Attending: Physician Assistant | Admitting: Physician Assistant

## 2016-03-14 ENCOUNTER — Other Ambulatory Visit (HOSPITAL_COMMUNITY): Payer: Self-pay | Admitting: Physician Assistant

## 2016-03-14 DIAGNOSIS — J9811 Atelectasis: Secondary | ICD-10-CM | POA: Insufficient documentation

## 2016-03-14 DIAGNOSIS — M5136 Other intervertebral disc degeneration, lumbar region: Secondary | ICD-10-CM | POA: Insufficient documentation

## 2016-03-14 DIAGNOSIS — M4316 Spondylolisthesis, lumbar region: Secondary | ICD-10-CM | POA: Insufficient documentation

## 2016-03-14 DIAGNOSIS — M4306 Spondylolysis, lumbar region: Secondary | ICD-10-CM | POA: Diagnosis present

## 2016-03-17 ENCOUNTER — Other Ambulatory Visit (HOSPITAL_COMMUNITY): Payer: Self-pay | Admitting: Physician Assistant

## 2016-03-17 ENCOUNTER — Ambulatory Visit (HOSPITAL_COMMUNITY)
Admission: RE | Admit: 2016-03-17 | Discharge: 2016-03-17 | Disposition: A | Discharge status: Home / Self Care | Payer: PRIVATE HEALTH INSURANCE | Source: Ambulatory Visit | Attending: Physician Assistant | Admitting: Physician Assistant

## 2016-03-17 DIAGNOSIS — M4316 Spondylolisthesis, lumbar region: Secondary | ICD-10-CM | POA: Diagnosis present

## 2016-03-17 DIAGNOSIS — M5136 Other intervertebral disc degeneration, lumbar region: Secondary | ICD-10-CM | POA: Diagnosis present

## 2016-03-17 DIAGNOSIS — M4306 Spondylolysis, lumbar region: Secondary | ICD-10-CM | POA: Diagnosis present

## 2016-03-25 ENCOUNTER — Ambulatory Visit: Payer: PRIVATE HEALTH INSURANCE | Admitting: Nurse Practitioner

## 2016-04-10 ENCOUNTER — Telehealth: Payer: Self-pay | Admitting: Gastroenterology

## 2016-04-10 ENCOUNTER — Encounter: Payer: Self-pay | Admitting: Gastroenterology

## 2016-04-10 ENCOUNTER — Ambulatory Visit: Payer: PRIVATE HEALTH INSURANCE | Admitting: Gastroenterology

## 2016-04-10 NOTE — Telephone Encounter (Signed)
PT WAS A NO SHOW AND LETTER SENT  °

## 2016-12-08 DIAGNOSIS — G894 Chronic pain syndrome: Secondary | ICD-10-CM | POA: Diagnosis not present

## 2016-12-08 DIAGNOSIS — E669 Obesity, unspecified: Secondary | ICD-10-CM | POA: Diagnosis not present

## 2016-12-08 DIAGNOSIS — Z6835 Body mass index (BMI) 35.0-35.9, adult: Secondary | ICD-10-CM | POA: Diagnosis not present

## 2016-12-08 DIAGNOSIS — Z23 Encounter for immunization: Secondary | ICD-10-CM | POA: Diagnosis not present

## 2016-12-08 DIAGNOSIS — F909 Attention-deficit hyperactivity disorder, unspecified type: Secondary | ICD-10-CM | POA: Diagnosis not present

## 2016-12-08 DIAGNOSIS — Z1389 Encounter for screening for other disorder: Secondary | ICD-10-CM | POA: Diagnosis not present

## 2016-12-18 ENCOUNTER — Telehealth: Payer: Self-pay

## 2016-12-18 NOTE — Telephone Encounter (Signed)
Pt referred for colonoscopy. OV due to med list. OV with Neil Crouch, PA on 01/06/2017 at 11:30 Am.

## 2017-01-01 DIAGNOSIS — Z1231 Encounter for screening mammogram for malignant neoplasm of breast: Secondary | ICD-10-CM | POA: Diagnosis not present

## 2017-01-06 ENCOUNTER — Encounter: Payer: Self-pay | Admitting: Gastroenterology

## 2017-01-06 ENCOUNTER — Telehealth: Payer: Self-pay | Admitting: Gastroenterology

## 2017-01-06 ENCOUNTER — Ambulatory Visit: Payer: PRIVATE HEALTH INSURANCE | Admitting: Gastroenterology

## 2017-01-06 NOTE — Telephone Encounter (Signed)
PT WAS A NO SHOW AND LETTER SENT  °

## 2017-02-03 DIAGNOSIS — H2511 Age-related nuclear cataract, right eye: Secondary | ICD-10-CM | POA: Diagnosis not present

## 2017-02-03 DIAGNOSIS — H04123 Dry eye syndrome of bilateral lacrimal glands: Secondary | ICD-10-CM | POA: Diagnosis not present

## 2017-02-03 DIAGNOSIS — H25812 Combined forms of age-related cataract, left eye: Secondary | ICD-10-CM | POA: Diagnosis not present

## 2017-02-20 ENCOUNTER — Telehealth: Payer: Self-pay | Admitting: Gastroenterology

## 2017-02-20 ENCOUNTER — Encounter: Payer: Self-pay | Admitting: General Practice

## 2017-02-20 ENCOUNTER — Ambulatory Visit: Payer: PRIVATE HEALTH INSURANCE | Admitting: Gastroenterology

## 2017-02-20 ENCOUNTER — Encounter: Payer: Self-pay | Admitting: Gastroenterology

## 2017-02-20 NOTE — Telephone Encounter (Signed)
PT WAS A NO SHOW AND LETTER SENT  °

## 2017-02-20 NOTE — Telephone Encounter (Signed)
PATIENT WAS A NO SHOW X 3, HAVE NOT MAILED LETTER

## 2017-02-20 NOTE — Telephone Encounter (Signed)
Discharge

## 2017-02-20 NOTE — Telephone Encounter (Signed)
Routing to Dr. Gala Romney for the approval to discharge the patient from the practice

## 2017-02-20 NOTE — Telephone Encounter (Signed)
Discharge letter mailed  

## 2017-03-03 DIAGNOSIS — Z1389 Encounter for screening for other disorder: Secondary | ICD-10-CM | POA: Diagnosis not present

## 2017-03-03 DIAGNOSIS — M47816 Spondylosis without myelopathy or radiculopathy, lumbar region: Secondary | ICD-10-CM | POA: Diagnosis not present

## 2017-03-03 DIAGNOSIS — M2550 Pain in unspecified joint: Secondary | ICD-10-CM | POA: Diagnosis not present

## 2017-03-03 DIAGNOSIS — Z6831 Body mass index (BMI) 31.0-31.9, adult: Secondary | ICD-10-CM | POA: Diagnosis not present

## 2017-03-03 DIAGNOSIS — F909 Attention-deficit hyperactivity disorder, unspecified type: Secondary | ICD-10-CM | POA: Diagnosis not present

## 2017-03-17 NOTE — Telephone Encounter (Signed)
Discharge letter was returned undeliverable. I have re-sent the letter via [employer] and made a copy of the envelope and sent it to be scanned.

## 2017-05-27 DIAGNOSIS — M1991 Primary osteoarthritis, unspecified site: Secondary | ICD-10-CM | POA: Diagnosis not present

## 2017-05-27 DIAGNOSIS — E669 Obesity, unspecified: Secondary | ICD-10-CM | POA: Diagnosis not present

## 2017-05-27 DIAGNOSIS — Z6831 Body mass index (BMI) 31.0-31.9, adult: Secondary | ICD-10-CM | POA: Diagnosis not present

## 2017-05-27 DIAGNOSIS — F419 Anxiety disorder, unspecified: Secondary | ICD-10-CM | POA: Diagnosis not present

## 2017-05-27 DIAGNOSIS — F329 Major depressive disorder, single episode, unspecified: Secondary | ICD-10-CM | POA: Diagnosis not present

## 2017-05-27 DIAGNOSIS — Z1389 Encounter for screening for other disorder: Secondary | ICD-10-CM | POA: Diagnosis not present

## 2017-05-27 DIAGNOSIS — J340 Abscess, furuncle and carbuncle of nose: Secondary | ICD-10-CM | POA: Diagnosis not present

## 2017-06-02 DIAGNOSIS — H25812 Combined forms of age-related cataract, left eye: Secondary | ICD-10-CM | POA: Diagnosis not present

## 2017-06-02 DIAGNOSIS — H2511 Age-related nuclear cataract, right eye: Secondary | ICD-10-CM | POA: Diagnosis not present

## 2017-06-02 DIAGNOSIS — H04123 Dry eye syndrome of bilateral lacrimal glands: Secondary | ICD-10-CM | POA: Diagnosis not present

## 2017-06-17 DIAGNOSIS — H25812 Combined forms of age-related cataract, left eye: Secondary | ICD-10-CM | POA: Diagnosis not present

## 2017-06-17 DIAGNOSIS — H2512 Age-related nuclear cataract, left eye: Secondary | ICD-10-CM | POA: Diagnosis not present

## 2017-07-03 DIAGNOSIS — T8522XA Displacement of intraocular lens, initial encounter: Secondary | ICD-10-CM | POA: Diagnosis not present

## 2017-07-03 DIAGNOSIS — H25811 Combined forms of age-related cataract, right eye: Secondary | ICD-10-CM | POA: Diagnosis not present

## 2017-07-03 DIAGNOSIS — H4302 Vitreous prolapse, left eye: Secondary | ICD-10-CM | POA: Diagnosis not present

## 2017-07-03 DIAGNOSIS — H33322 Round hole, left eye: Secondary | ICD-10-CM | POA: Diagnosis not present

## 2017-07-03 DIAGNOSIS — H53132 Sudden visual loss, left eye: Secondary | ICD-10-CM | POA: Diagnosis not present

## 2017-07-15 ENCOUNTER — Ambulatory Visit: Payer: Self-pay | Admitting: Ophthalmology

## 2017-07-15 ENCOUNTER — Encounter (HOSPITAL_COMMUNITY): Payer: Self-pay | Admitting: *Deleted

## 2017-07-15 NOTE — H&P (Signed)
Date of examination:  07/03/17  Indication for surgery: subluxed/dislocated intraocular lens left eye  Pertinent past medical history:  Past Medical History:  Diagnosis Date  . Back pain   . Depression   . Hypertension     Pertinent ocular history: Cataract surgery  Pertinent family history: No family history on file.  General:  Healthy appearing patient in no distress.    Eyes:    Acuity OD 20/50-2  OS 20/80-2  [place]  External: Within normal limits     Anterior segment: subluxed/dislocated IOL OS    Fundus: Normal      Impression: subluxed/dislocated IOL left eye  Plan: Pars plana vitrectomy, anterior vitectomy, suture IOL left eye  Royston Cowper

## 2017-07-15 NOTE — Progress Notes (Signed)
Pt denies SOB, chest pain, and being under the care of a cardiologist. Pt denies having a stress test, echo and cardiac cath. Pt denies having a chest x ray and EKG within the last year. Pt denies having recent labs. Pt made aware to stop taking vitamins, fish oil, Reishi Mushrooms and herbal medications. Do not take any NSAIDs ie: Ibuprofen, Advil, Naproxen (Aleve), Motrin, BC and Goody Powder or any medication containing Aspirin. Pt verbalized understanding of all pre-op instructions.

## 2017-07-16 ENCOUNTER — Encounter (HOSPITAL_COMMUNITY)
Admission: RE | Disposition: A | Discharge status: Home / Self Care | Payer: Self-pay | Source: Ambulatory Visit | Attending: Ophthalmology

## 2017-07-16 ENCOUNTER — Ambulatory Visit (HOSPITAL_COMMUNITY): Payer: BLUE CROSS/BLUE SHIELD | Admitting: Anesthesiology

## 2017-07-16 ENCOUNTER — Ambulatory Visit: Payer: Self-pay | Admitting: Ophthalmology

## 2017-07-16 ENCOUNTER — Ambulatory Visit (HOSPITAL_COMMUNITY)
Admission: RE | Admit: 2017-07-16 | Discharge: 2017-07-16 | Disposition: A | Discharge status: Home / Self Care | Payer: BLUE CROSS/BLUE SHIELD | Source: Ambulatory Visit | Attending: Ophthalmology | Admitting: Ophthalmology

## 2017-07-16 ENCOUNTER — Encounter (HOSPITAL_COMMUNITY): Payer: Self-pay | Admitting: *Deleted

## 2017-07-16 DIAGNOSIS — Z87891 Personal history of nicotine dependence: Secondary | ICD-10-CM | POA: Diagnosis not present

## 2017-07-16 DIAGNOSIS — I10 Essential (primary) hypertension: Secondary | ICD-10-CM | POA: Diagnosis not present

## 2017-07-16 DIAGNOSIS — T8522XA Displacement of intraocular lens, initial encounter: Secondary | ICD-10-CM | POA: Diagnosis not present

## 2017-07-16 DIAGNOSIS — Y838 Other surgical procedures as the cause of abnormal reaction of the patient, or of later complication, without mention of misadventure at the time of the procedure: Secondary | ICD-10-CM | POA: Insufficient documentation

## 2017-07-16 DIAGNOSIS — H2702 Aphakia, left eye: Secondary | ICD-10-CM | POA: Diagnosis not present

## 2017-07-16 DIAGNOSIS — H4302 Vitreous prolapse, left eye: Secondary | ICD-10-CM | POA: Diagnosis not present

## 2017-07-16 HISTORY — DX: Unspecified osteoarthritis, unspecified site: M19.90

## 2017-07-16 HISTORY — PX: LASER PHOTO ABLATION: SHX5942

## 2017-07-16 HISTORY — PX: PARS PLANA VITRECTOMY: SHX2166

## 2017-07-16 HISTORY — DX: Spondylosis, unspecified: M47.9

## 2017-07-16 HISTORY — PX: INTRAOCULAR LENS REMOVAL: SHX5339

## 2017-07-16 HISTORY — DX: Pneumonia, unspecified organism: J18.9

## 2017-07-16 HISTORY — DX: Other specified behavioral and emotional disorders with onset usually occurring in childhood and adolescence: F98.8

## 2017-07-16 HISTORY — DX: Other chronic pain: G89.29

## 2017-07-16 HISTORY — DX: Dorsalgia, unspecified: M54.9

## 2017-07-16 LAB — BASIC METABOLIC PANEL
Anion gap: 9 (ref 5–15)
BUN: 14 mg/dL (ref 6–20)
CO2: 27 mmol/L (ref 22–32)
CREATININE: 0.62 mg/dL (ref 0.44–1.00)
Calcium: 9.7 mg/dL (ref 8.9–10.3)
Chloride: 100 mmol/L — ABNORMAL LOW (ref 101–111)
Glucose, Bld: 93 mg/dL (ref 65–99)
POTASSIUM: 3 mmol/L — AB (ref 3.5–5.1)
SODIUM: 136 mmol/L (ref 135–145)

## 2017-07-16 LAB — CBC
HCT: 40.9 % (ref 36.0–46.0)
Hemoglobin: 14 g/dL (ref 12.0–15.0)
MCH: 29.9 pg (ref 26.0–34.0)
MCHC: 34.2 g/dL (ref 30.0–36.0)
MCV: 87.4 fL (ref 78.0–100.0)
Platelets: 274 10*3/uL (ref 150–400)
RBC: 4.68 MIL/uL (ref 3.87–5.11)
RDW: 12.6 % (ref 11.5–15.5)
WBC: 6.1 10*3/uL (ref 4.0–10.5)

## 2017-07-16 SURGERY — PARS PLANA VITRECTOMY WITH 25 GAUGE
Anesthesia: Monitor Anesthesia Care | Site: Eye | Laterality: Left

## 2017-07-16 MED ORDER — LIDOCAINE HCL (CARDIAC) 20 MG/ML IV SOLN
INTRAVENOUS | Status: DC | PRN
  Administered 2017-07-16: 50 mg via INTRATRACHEAL

## 2017-07-16 MED ORDER — PHENYLEPHRINE HCL 10 MG/ML IJ SOLN
INTRAVENOUS | Status: DC | PRN
  Administered 2017-07-16: 40 ug/min via INTRAVENOUS

## 2017-07-16 MED ORDER — EPINEPHRINE PF 1 MG/ML IJ SOLN
INTRAOCULAR | Status: DC | PRN
  Administered 2017-07-16: 16:00:00

## 2017-07-16 MED ORDER — PROPARACAINE HCL 0.5 % OP SOLN
1.0000 [drp] | OPHTHALMIC | Status: AC | PRN
  Administered 2017-07-16: 1 [drp] via OPHTHALMIC

## 2017-07-16 MED ORDER — HYPROMELLOSE (GONIOSCOPIC) 2.5 % OP SOLN
OPHTHALMIC | Status: AC
  Filled 2017-07-16: qty 15

## 2017-07-16 MED ORDER — PHENYLEPHRINE HCL 2.5 % OP SOLN
1.0000 [drp] | OPHTHALMIC | Status: AC | PRN
  Administered 2017-07-16 (×3): 1 [drp] via OPHTHALMIC

## 2017-07-16 MED ORDER — TROPICAMIDE 1 % OP SOLN
1.0000 [drp] | OPHTHALMIC | Status: AC | PRN
  Administered 2017-07-16 (×3): 1 [drp] via OPHTHALMIC

## 2017-07-16 MED ORDER — DEXAMETHASONE SODIUM PHOSPHATE 10 MG/ML IJ SOLN
INTRAMUSCULAR | Status: DC | PRN
  Administered 2017-07-16: 10 mg

## 2017-07-16 MED ORDER — PHENYLEPHRINE HCL 10 MG/ML IJ SOLN
INTRAMUSCULAR | Status: DC | PRN
  Administered 2017-07-16: 120 ug via INTRAVENOUS
  Administered 2017-07-16: 40 ug via INTRAVENOUS
  Administered 2017-07-16 (×2): 120 ug via INTRAVENOUS

## 2017-07-16 MED ORDER — DEXAMETHASONE SODIUM PHOSPHATE 10 MG/ML IJ SOLN
INTRAMUSCULAR | Status: AC
  Filled 2017-07-16: qty 1

## 2017-07-16 MED ORDER — OFLOXACIN 0.3 % OP SOLN: 1.0000 [drp] | OPHTHALMIC | Status: DC | PRN

## 2017-07-16 MED ORDER — SODIUM HYALURONATE 10 MG/ML IO SOLN
INTRAOCULAR | Status: AC
  Filled 2017-07-16: qty 0.85

## 2017-07-16 MED ORDER — EPINEPHRINE PF 1 MG/ML IJ SOLN
INTRAMUSCULAR | Status: AC
  Filled 2017-07-16: qty 1

## 2017-07-16 MED ORDER — TRIAMCINOLONE ACETONIDE 40 MG/ML IJ SUSP
INTRAMUSCULAR | Status: AC
  Filled 2017-07-16: qty 5

## 2017-07-16 MED ORDER — BSS PLUS IO SOLN
INTRAOCULAR | Status: AC
  Filled 2017-07-16: qty 500

## 2017-07-16 MED ORDER — PHENYLEPHRINE HCL 2.5 % OP SOLN
OPHTHALMIC | Status: AC
  Administered 2017-07-16: 1 [drp] via OPHTHALMIC
  Filled 2017-07-16: qty 2

## 2017-07-16 MED ORDER — 0.9 % SODIUM CHLORIDE (POUR BTL) OPTIME
TOPICAL | Status: DC | PRN
  Administered 2017-07-16: 200 mL

## 2017-07-16 MED ORDER — PROPOFOL 10 MG/ML IV BOLUS
INTRAVENOUS | Status: AC
  Filled 2017-07-16: qty 20

## 2017-07-16 MED ORDER — ATROPINE SULFATE 1 % OP SOLN
OPHTHALMIC | Status: AC
  Filled 2017-07-16: qty 5

## 2017-07-16 MED ORDER — OFLOXACIN 0.3 % OP SOLN
OPHTHALMIC | Status: AC
  Administered 2017-07-16: 1 [drp] via OPHTHALMIC
  Filled 2017-07-16: qty 5

## 2017-07-16 MED ORDER — ACETYLCHOLINE CHLORIDE 20 MG IO SOLR
INTRAOCULAR | Status: DC | PRN
  Administered 2017-07-16: 10 mg via INTRAOCULAR

## 2017-07-16 MED ORDER — MIDAZOLAM HCL 5 MG/5ML IJ SOLN
INTRAMUSCULAR | Status: DC | PRN
  Administered 2017-07-16: 2 mg via INTRAVENOUS

## 2017-07-16 MED ORDER — ATENOLOL 50 MG PO TABS
50.0000 mg | ORAL_TABLET | Freq: Once | ORAL | Status: DC
  Filled 2017-07-16: qty 1

## 2017-07-16 MED ORDER — BUPIVACAINE HCL (PF) 0.75 % IJ SOLN
INTRAMUSCULAR | Status: AC
  Filled 2017-07-16: qty 10

## 2017-07-16 MED ORDER — CYCLOPENTOLATE HCL 1 % OP SOLN
OPHTHALMIC | Status: AC
  Administered 2017-07-16: 1 [drp] via OPHTHALMIC
  Filled 2017-07-16: qty 2

## 2017-07-16 MED ORDER — MIDAZOLAM HCL 2 MG/2ML IJ SOLN
INTRAMUSCULAR | Status: AC
  Filled 2017-07-16: qty 2

## 2017-07-16 MED ORDER — CHLORTHALIDONE 25 MG PO TABS
25.0000 mg | ORAL_TABLET | Freq: Once | ORAL | Status: DC
  Filled 2017-07-16: qty 1

## 2017-07-16 MED ORDER — SODIUM HYALURONATE 10 MG/ML IO SOLN
INTRAOCULAR | Status: DC | PRN
  Administered 2017-07-16: 0.85 mL via INTRAOCULAR

## 2017-07-16 MED ORDER — OFLOXACIN 0.3 % OP SOLN
1.0000 [drp] | OPHTHALMIC | Status: AC | PRN
  Administered 2017-07-16 (×3): 1 [drp] via OPHTHALMIC

## 2017-07-16 MED ORDER — PROPARACAINE HCL 0.5 % OP SOLN
OPHTHALMIC | Status: AC
  Administered 2017-07-16: 1 [drp] via OPHTHALMIC
  Filled 2017-07-16: qty 15

## 2017-07-16 MED ORDER — CYCLOPENTOLATE HCL 1 % OP SOLN
1.0000 [drp] | OPHTHALMIC | Status: AC | PRN
  Administered 2017-07-16 (×3): 1 [drp] via OPHTHALMIC

## 2017-07-16 MED ORDER — PROPOFOL 500 MG/50ML IV EMUL
INTRAVENOUS | Status: DC | PRN
  Administered 2017-07-16: 75 ug/kg/min via INTRAVENOUS

## 2017-07-16 MED ORDER — HYPROMELLOSE (GONIOSCOPIC) 2.5 % OP SOLN
OPHTHALMIC | Status: DC | PRN
  Administered 2017-07-16: 1 [drp] via OPHTHALMIC

## 2017-07-16 MED ORDER — TETRACAINE HCL 0.5 % OP SOLN
OPHTHALMIC | Status: AC
  Filled 2017-07-16: qty 4

## 2017-07-16 MED ORDER — LIDOCAINE HCL 2 % IJ SOLN
INTRAMUSCULAR | Status: DC | PRN
  Administered 2017-07-16: 5 mL via RETROBULBAR

## 2017-07-16 MED ORDER — TRIAMCINOLONE ACETONIDE 40 MG/ML IJ SUSP
INTRAMUSCULAR | Status: DC | PRN
  Administered 2017-07-16: 40 mg

## 2017-07-16 MED ORDER — CEFAZOLIN 200 MG/ML FOR DIALYSIS
INJECTION | INTRAOCULAR | Status: DC | PRN
  Administered 2017-07-16: .5 mL via INTRAPERITONEAL

## 2017-07-16 MED ORDER — TOBRAMYCIN-DEXAMETHASONE 0.3-0.1 % OP OINT
TOPICAL_OINTMENT | OPHTHALMIC | Status: AC
  Filled 2017-07-16: qty 3.5

## 2017-07-16 MED ORDER — LIDOCAINE HCL 2 % IJ SOLN
INTRAMUSCULAR | Status: AC
  Filled 2017-07-16: qty 20

## 2017-07-16 MED ORDER — STERILE WATER FOR IRRIGATION IR SOLN
Status: DC | PRN
  Administered 2017-07-16: 200 mL

## 2017-07-16 MED ORDER — HYALURONIDASE HUMAN 150 UNIT/ML IJ SOLN
INTRAMUSCULAR | Status: AC
  Filled 2017-07-16: qty 1

## 2017-07-16 MED ORDER — SODIUM CHLORIDE 0.9 % IV SOLN
INTRAVENOUS | Status: DC
  Administered 2017-07-16: 14:00:00 via INTRAVENOUS

## 2017-07-16 MED ORDER — CEFAZOLIN SUBCONJUNCTIVAL INJECTION 100 MG/0.5 ML
100.0000 mg | INJECTION | SUBCONJUNCTIVAL | Status: DC
  Filled 2017-07-16: qty 5

## 2017-07-16 MED ORDER — TROPICAMIDE 1 % OP SOLN
OPHTHALMIC | Status: AC
  Administered 2017-07-16: 1 [drp] via OPHTHALMIC
  Filled 2017-07-16: qty 15

## 2017-07-16 MED ORDER — BSS IO SOLN
INTRAOCULAR | Status: AC
  Filled 2017-07-16: qty 15

## 2017-07-16 MED ORDER — ACETYLCHOLINE CHLORIDE 20 MG IO SOLR
INTRAOCULAR | Status: AC
  Filled 2017-07-16: qty 1

## 2017-07-16 SURGICAL SUPPLY — 60 items
APPLICATOR COTTON TIP 6IN STRL (MISCELLANEOUS) ×3 IMPLANT
BLADE KERATOME 2.75 (BLADE) ×3 IMPLANT
BLADE MVR KNIFE 20G (BLADE) IMPLANT
CANNULA ANT CHAM MAIN (OPHTHALMIC RELATED) IMPLANT
CANNULA DUAL BORE 23G (CANNULA) IMPLANT
CANNULA DUALBORE 25G (CANNULA) IMPLANT
CANNULA VLV SOFT TIP 25GA (OPHTHALMIC) ×3 IMPLANT
CARTRIDGE LENS MTC 60C NO CHG (INSTRUMENTS) ×3 IMPLANT
CAUTERY EYE LOW TEMP 1300F FIN (OPHTHALMIC RELATED) IMPLANT
CLSR STERI-STRIP ANTIMIC 1/2X4 (GAUZE/BANDAGES/DRESSINGS) ×3 IMPLANT
COVER MAYO STAND STRL (DRAPES) ×3 IMPLANT
DRAPE HALF SHEET 40X57 (DRAPES) ×3 IMPLANT
DRAPE INCISE 51X51 W/FILM STRL (DRAPES) ×3 IMPLANT
DRAPE RETRACTOR (MISCELLANEOUS) ×3 IMPLANT
ERASER HMR WETFIELD 23G BP (MISCELLANEOUS) IMPLANT
FORCEPS ECKARDT ILM 25G SERR (OPHTHALMIC RELATED) IMPLANT
FORCEPS GRIESHABER ILM 25G A (INSTRUMENTS) IMPLANT
GAS AUTO FILL CONSTEL (OPHTHALMIC)
GAS AUTO FILL CONSTELLATION (OPHTHALMIC) IMPLANT
GLOVE BIO SURGEON STRL SZ 6.5 (GLOVE) ×3 IMPLANT
GLOVE BIOGEL PI IND STRL 7.0 (GLOVE) ×2 IMPLANT
GLOVE BIOGEL PI INDICATOR 7.0 (GLOVE) ×1
GLOVE ECLIPSE 7.5 STRL STRAW (GLOVE) ×3 IMPLANT
GOWN STRL REUS W/ TWL LRG LVL3 (GOWN DISPOSABLE) ×4 IMPLANT
GOWN STRL REUS W/TWL LRG LVL3 (GOWN DISPOSABLE) ×2
HANDLE PNEUMATIC FOR CONSTEL (OPHTHALMIC) IMPLANT
KIT BASIN OR (CUSTOM PROCEDURE TRAY) ×3 IMPLANT
KIT ROOM TURNOVER OR (KITS) ×3 IMPLANT
KNIFE CRESCENT 2.5 55 ANG (BLADE) ×3 IMPLANT
LENS BIOM SUPER VIEW SET DISP (OPHTHALMIC RELATED) ×3 IMPLANT
LENS IOL 3 PIECE DIOP 24.0 (Intraocular Lens) ×3 IMPLANT
LENS IOL TECNIS MONO 3P 24.0 (Intraocular Lens) ×2 IMPLANT
MICROPICK 25G (MISCELLANEOUS)
NEEDLE 18GX1X1/2 (RX/OR ONLY) (NEEDLE) ×6 IMPLANT
NEEDLE 25GX 5/8IN NON SAFETY (NEEDLE) ×3 IMPLANT
NEEDLE FILTER BLUNT 18X 1/2SAF (NEEDLE) ×1
NEEDLE FILTER BLUNT 18X1 1/2 (NEEDLE) ×2 IMPLANT
NEEDLE HYPO 25GX1X1/2 BEV (NEEDLE) IMPLANT
NEEDLE HYPO 30X.5 LL (NEEDLE) ×6 IMPLANT
NEEDLE RETROBULBAR 25GX1.5 (NEEDLE) ×3 IMPLANT
NS IRRIG 1000ML POUR BTL (IV SOLUTION) ×3 IMPLANT
PACK VITRECTOMY CUSTOM (CUSTOM PROCEDURE TRAY) ×3 IMPLANT
PAD ARMBOARD 7.5X6 YLW CONV (MISCELLANEOUS) ×6 IMPLANT
PAK PIK VITRECTOMY CVS 25GA (OPHTHALMIC) ×3 IMPLANT
PENCIL BIPOLAR 25GA STR DISP (OPHTHALMIC RELATED) IMPLANT
PICK MICROPICK 25G (MISCELLANEOUS) IMPLANT
PROBE LASER ILLUM FLEX CVD 25G (OPHTHALMIC) IMPLANT
ROLLS DENTAL (MISCELLANEOUS) IMPLANT
SCRAPER DIAMOND 25GA (OPHTHALMIC RELATED) IMPLANT
SOLUTION ANTI FOG 6CC (MISCELLANEOUS) ×3 IMPLANT
STOPCOCK 4 WAY LG BORE MALE ST (IV SETS) IMPLANT
SUT ETHILON 10 0 CS140 6 (SUTURE) ×3 IMPLANT
SUT VICRYL 7 0 TG140 8 (SUTURE) IMPLANT
SUT VICRYL 8 0 TG140 8 (SUTURE) ×3 IMPLANT
SYR 10ML LL (SYRINGE) IMPLANT
SYR 20CC LL (SYRINGE) ×3 IMPLANT
SYR 5ML LL (SYRINGE) ×3 IMPLANT
SYR TB 1ML LUER SLIP (SYRINGE) IMPLANT
WATER STERILE IRR 1000ML POUR (IV SOLUTION) ×3 IMPLANT
WIPE INSTRUMENT VISIWIPE 73X73 (MISCELLANEOUS) IMPLANT

## 2017-07-16 NOTE — Brief Op Note (Signed)
07/16/2017  4:44 PM  PATIENT:  Jasmine Potter  51 y.o. female  PRE-OPERATIVE DIAGNOSIS:  DISLOCATION OF IOL LEFT EYE WITH ANTERIOR VITREOUS PROLAPSE  POST-OPERATIVE DIAGNOSIS:  DISLOCATION OF IOL LEFT EYE WITH ANTERIOR VITREOUS PROLAPSE AND RETINAL BREAKS  PROCEDURE:  Procedure(s): PARS PLANA VITRECTOMY, ANTERIOR VITRECTOMY,  REMOVAL OF INTRAOCULAR LENS (Left), AND  PLACEMENT OF INTRAOCULAR LENS (Left)  LASER PHOTO ABLATION (Left)  SURGEON:  Surgeon(s) and Role:    * Jalene Mullet, MD - Primary  PHYSICIAN ASSISTANT:   ASSISTANTS: none   ANESTHESIA:   local and MAC  EBL:  Total I/O In: 120 [I.V.:120] Out: -   BLOOD ADMINISTERED:none  DRAINS: none   LOCAL MEDICATIONS USED:  MARCAINE    and LIDOCAINE   SPECIMEN:  No Specimen  DISPOSITION OF SPECIMEN:  N/A  COUNTS:  YES  TOURNIQUET:  * No tourniquets in log *  DICTATION: .Note written in EPIC  PLAN OF CARE: Discharge to home after PACU  PATIENT DISPOSITION:  PACU - hemodynamically stable.   Delay start of Pharmacological VTE agent (>24hrs) due to surgical blood loss or risk of bleeding: not applicable

## 2017-07-16 NOTE — Transfer of Care (Signed)
Immediate Anesthesia Transfer of Care Note  Patient: Jasmine Potter  Procedure(s) Performed: Procedure(s): PARS PLANA VITRECTOMY WITH 25 GAUGE WITH PLACEMENT OF INTRAOCULAR LENS (Left) REMOVAL OF INTRAOCULAR LENS (Left) LASER PHOTO ABLATION (Left)  Patient Location: PACU  Anesthesia Type:MAC  Level of Consciousness: awake, alert , oriented and patient cooperative  Airway & Oxygen Therapy: Patient Spontanous Breathing  Post-op Assessment: Report given to RN and Post -op Vital signs reviewed and stable  Post vital signs: Reviewed and stable  Last Vitals:  Vitals:   07/16/17 1012  BP: 117/68  Pulse: 61  Resp: 18  Temp: 36.4 C  SpO2: 100%    Last Pain:  Vitals:   07/16/17 1012  TempSrc: Oral      Patients Stated Pain Goal: 4 (16/10/96 0454)  Complications: No apparent anesthesia complications

## 2017-07-16 NOTE — H&P (Signed)
  Date of examination:    Indication for surgery: subluxed/dislocated IOL  Pertinent past medical history:  Past Medical History:  Diagnosis Date  . ADD (attention deficit disorder)   . Chronic back pain   . Hypertension   . OA (osteoarthritis)   . Pneumonia   . Spondylosis     Pertinent ocular history:  Subluxed/dislocated IOL  Pertinent family history:  Family History  Problem Relation Age of Onset  . Breast cancer Mother   . Heart attack Father   . Leukemia Sister   . Colon cancer Brother   . Breast cancer Sister     General:  Healthy appearing patient in no distress.    Eyes:    Acuity OD 20/50-2  OS 20/80-2  [place]  External: Within normal limits     Anterior segment: disclocated/subluxed IOL left eye, vitreous prolapse anteriorly      Fundus: Normal OD, vitreous opacities OS      Impression: disclocated/subluxed IOL left eye, vitreous prolapse anteriorly, vitreous prolapse   Plan: pars plana vitrectomy, anterior vitrectomy, removal of IOL and replacement of IOL vs reposition of IOL left eye  Royston Cowper

## 2017-07-16 NOTE — Anesthesia Preprocedure Evaluation (Addendum)
Anesthesia Evaluation  Patient identified by MRN, date of birth, ID band Patient awake    Reviewed: Allergy & Precautions, NPO status , Patient's Chart, lab work & pertinent test results  Airway Mallampati: II  TM Distance: >3 FB Neck ROM: Full    Dental no notable dental hx.    Pulmonary neg pulmonary ROS, former smoker,    Pulmonary exam normal breath sounds clear to auscultation       Cardiovascular hypertension, negative cardio ROS Normal cardiovascular exam Rhythm:Regular Rate:Normal     Neuro/Psych negative neurological ROS  negative psych ROS   GI/Hepatic negative GI ROS, Neg liver ROS,   Endo/Other  negative endocrine ROS  Renal/GU negative Renal ROS  negative genitourinary   Musculoskeletal negative musculoskeletal ROS (+)   Abdominal   Peds negative pediatric ROS (+)  Hematology negative hematology ROS (+)   Anesthesia Other Findings   Reproductive/Obstetrics negative OB ROS                            Anesthesia Physical Anesthesia Plan  ASA: II  Anesthesia Plan: MAC   Post-op Pain Management:    Induction: Intravenous  PONV Risk Score and Plan:   Airway Management Planned: Mask and Natural Airway  Additional Equipment:   Intra-op Plan:   Post-operative Plan:   Informed Consent:   Plan Discussed with:   Anesthesia Plan Comments:         Anesthesia Quick Evaluation

## 2017-07-16 NOTE — Anesthesia Postprocedure Evaluation (Signed)
Anesthesia Post Note  Patient: Nurse, mental health  Procedure(s) Performed: Procedure(s) (LRB): PARS PLANA VITRECTOMY WITH 25 GAUGE WITH PLACEMENT OF INTRAOCULAR LENS (Left) REMOVAL OF INTRAOCULAR LENS (Left) LASER PHOTO ABLATION (Left)     Patient location during evaluation: PACU Anesthesia Type: MAC Level of consciousness: awake and alert Pain management: pain level controlled Vital Signs Assessment: post-procedure vital signs reviewed and stable Respiratory status: spontaneous breathing, nonlabored ventilation, respiratory function stable and patient connected to nasal cannula oxygen Cardiovascular status: stable and blood pressure returned to baseline Anesthetic complications: no    Last Vitals:  Vitals:   07/16/17 1012 07/16/17 1647  BP: 117/68 109/65  Pulse: 61 (!) 52  Resp: 18 14  Temp: 36.4 C   SpO2: 100% 100%    Last Pain:  Vitals:   07/16/17 1647  TempSrc:   PainSc: 0-No pain                 Jasmine Potter

## 2017-07-16 NOTE — Discharge Instructions (Addendum)
DO NOT SLEEP ON BACK FOR FIRST 2 NIGHTS.

## 2017-07-17 ENCOUNTER — Encounter (HOSPITAL_COMMUNITY): Payer: Self-pay | Admitting: Emergency Medicine

## 2017-07-17 ENCOUNTER — Encounter (HOSPITAL_COMMUNITY): Payer: Self-pay | Admitting: Ophthalmology

## 2017-07-22 NOTE — Op Note (Signed)
Aletta R Rens 07/22/2017 Diagnosis: Vitreous prolapse, dislocated intraocular lens, transient aphakia  Procedure: Pars Plana Vitrectomy and Anterior vitrectomy, removal of IOL, and placement of IOL in ciliary sulcus Operative Eye:  left eye  Surgeon: Royston Cowper Estimated Blood Loss: minimal Specimens for Pathology:  None Complications: none   The  patient was prepped and draped in the usual fashion for ocular surgery on the  left eye .  A lid speculum was placed.  Infusion line and trocar was placed at the 4 o'clock position approximately 3.5 mm from the surgical limbus.   The infusion line was allowed to run and then clamped when placed at the cannula opening. The line was inserted and secured to the drape with an adhesive strip.   Active trocars/cannula were placed at the 10 and 2 o'clock positions approximately 3.5 mm from the surgical limbus. The cannula was visualized in the vitreous cavity.  The light pipe and vitreous cutter were inserted into the vitreous cavity and a core vitrectomy was performed.  Care taken to remove the vitreous up to the vitreous base for 360 degrees and release the vitreous attachments to the dislocated intraocular lens (IOL).  A superior limbal peritomy was made.  The IOL was removed from the posterio segment after a 43mm scleral tunnel wound was created at the superior limbus.  Anterior vitrectomy was performed to remove the vitreous prolapse and vitreous to the corneal wound.  After adequate vitrectomy and inspection of the remaining capsule, a 3 piece 24 diopter intraocular lens was placed in the ciliary sulcus with good stabilization.    A partial air-fluid exchange was performed.  The trocars were sequentially removed and the sclerotomies closed with 8-0 vicryl.  The scleral tunnel was closed with one 10-0 nylon suture and buried.  The overlying conjunctiva was closed with 8-0 Vicryl.  Subconjunctival injections of  Dexamethasone 4mg /58ml was placed in  the infero-medial quadrant.   The speculum and drapes were removed and the eye was patched with Polymixin/Bacitracin ophthalmic ointment. An eye shield was placed and the patient was transferred alert and conversant with stable vital signs to the post operative recovery area.  The patient tolerated the procedure well and no complications were noted.  Royston Cowper MD

## 2017-07-28 DIAGNOSIS — H35352 Cystoid macular degeneration, left eye: Secondary | ICD-10-CM | POA: Diagnosis not present

## 2017-08-10 DIAGNOSIS — H35352 Cystoid macular degeneration, left eye: Secondary | ICD-10-CM | POA: Diagnosis not present

## 2017-09-01 DIAGNOSIS — Z6833 Body mass index (BMI) 33.0-33.9, adult: Secondary | ICD-10-CM | POA: Diagnosis not present

## 2017-09-01 DIAGNOSIS — F419 Anxiety disorder, unspecified: Secondary | ICD-10-CM | POA: Diagnosis not present

## 2017-09-01 DIAGNOSIS — M2550 Pain in unspecified joint: Secondary | ICD-10-CM | POA: Diagnosis not present

## 2017-09-01 DIAGNOSIS — F909 Attention-deficit hyperactivity disorder, unspecified type: Secondary | ICD-10-CM | POA: Diagnosis not present

## 2017-09-01 DIAGNOSIS — M1991 Primary osteoarthritis, unspecified site: Secondary | ICD-10-CM | POA: Diagnosis not present

## 2017-09-01 DIAGNOSIS — Z1389 Encounter for screening for other disorder: Secondary | ICD-10-CM | POA: Diagnosis not present

## 2017-09-01 DIAGNOSIS — E6609 Other obesity due to excess calories: Secondary | ICD-10-CM | POA: Diagnosis not present

## 2017-09-01 DIAGNOSIS — G894 Chronic pain syndrome: Secondary | ICD-10-CM | POA: Diagnosis not present

## 2017-09-01 DIAGNOSIS — E669 Obesity, unspecified: Secondary | ICD-10-CM | POA: Diagnosis not present

## 2017-09-14 DIAGNOSIS — H35352 Cystoid macular degeneration, left eye: Secondary | ICD-10-CM | POA: Diagnosis not present

## 2017-10-23 DIAGNOSIS — H43392 Other vitreous opacities, left eye: Secondary | ICD-10-CM | POA: Diagnosis not present

## 2017-10-23 DIAGNOSIS — Z961 Presence of intraocular lens: Secondary | ICD-10-CM | POA: Diagnosis not present

## 2017-10-23 DIAGNOSIS — H25811 Combined forms of age-related cataract, right eye: Secondary | ICD-10-CM | POA: Diagnosis not present

## 2017-10-23 DIAGNOSIS — H35352 Cystoid macular degeneration, left eye: Secondary | ICD-10-CM | POA: Diagnosis not present

## 2017-11-09 DIAGNOSIS — H35352 Cystoid macular degeneration, left eye: Secondary | ICD-10-CM | POA: Diagnosis not present

## 2017-11-27 DIAGNOSIS — Z23 Encounter for immunization: Secondary | ICD-10-CM | POA: Diagnosis not present

## 2017-11-27 DIAGNOSIS — M1991 Primary osteoarthritis, unspecified site: Secondary | ICD-10-CM | POA: Diagnosis not present

## 2017-11-27 DIAGNOSIS — Z6834 Body mass index (BMI) 34.0-34.9, adult: Secondary | ICD-10-CM | POA: Diagnosis not present

## 2017-11-27 DIAGNOSIS — Z1389 Encounter for screening for other disorder: Secondary | ICD-10-CM | POA: Diagnosis not present

## 2017-11-27 DIAGNOSIS — F419 Anxiety disorder, unspecified: Secondary | ICD-10-CM | POA: Diagnosis not present

## 2017-11-27 DIAGNOSIS — F909 Attention-deficit hyperactivity disorder, unspecified type: Secondary | ICD-10-CM | POA: Diagnosis not present

## 2017-12-08 DIAGNOSIS — M25562 Pain in left knee: Secondary | ICD-10-CM | POA: Diagnosis not present

## 2017-12-08 DIAGNOSIS — Z1389 Encounter for screening for other disorder: Secondary | ICD-10-CM | POA: Diagnosis not present

## 2017-12-08 DIAGNOSIS — Z6833 Body mass index (BMI) 33.0-33.9, adult: Secondary | ICD-10-CM | POA: Diagnosis not present

## 2017-12-08 DIAGNOSIS — E6609 Other obesity due to excess calories: Secondary | ICD-10-CM | POA: Diagnosis not present

## 2017-12-09 ENCOUNTER — Ambulatory Visit (HOSPITAL_COMMUNITY)
Admission: RE | Admit: 2017-12-09 | Discharge: 2017-12-09 | Disposition: A | Discharge status: Home / Self Care | Payer: BLUE CROSS/BLUE SHIELD | Source: Ambulatory Visit | Attending: Physician Assistant | Admitting: Physician Assistant

## 2017-12-09 ENCOUNTER — Other Ambulatory Visit (HOSPITAL_COMMUNITY): Payer: Self-pay | Admitting: Physician Assistant

## 2017-12-09 DIAGNOSIS — M25562 Pain in left knee: Secondary | ICD-10-CM

## 2017-12-22 DIAGNOSIS — F909 Attention-deficit hyperactivity disorder, unspecified type: Secondary | ICD-10-CM | POA: Diagnosis not present

## 2017-12-22 DIAGNOSIS — Z6834 Body mass index (BMI) 34.0-34.9, adult: Secondary | ICD-10-CM | POA: Diagnosis not present

## 2017-12-22 DIAGNOSIS — Z1389 Encounter for screening for other disorder: Secondary | ICD-10-CM | POA: Diagnosis not present

## 2017-12-22 DIAGNOSIS — E6609 Other obesity due to excess calories: Secondary | ICD-10-CM | POA: Diagnosis not present

## 2018-01-13 DIAGNOSIS — K5730 Diverticulosis of large intestine without perforation or abscess without bleeding: Secondary | ICD-10-CM | POA: Diagnosis not present

## 2018-01-13 DIAGNOSIS — D126 Benign neoplasm of colon, unspecified: Secondary | ICD-10-CM | POA: Diagnosis not present

## 2018-01-13 DIAGNOSIS — Z86010 Personal history of colonic polyps: Secondary | ICD-10-CM | POA: Diagnosis not present

## 2018-01-13 DIAGNOSIS — Z800 Family history of malignant neoplasm of digestive organs: Secondary | ICD-10-CM | POA: Diagnosis not present

## 2018-01-19 DIAGNOSIS — D126 Benign neoplasm of colon, unspecified: Secondary | ICD-10-CM | POA: Diagnosis not present

## 2018-01-27 DIAGNOSIS — E669 Obesity, unspecified: Secondary | ICD-10-CM | POA: Diagnosis not present

## 2018-01-27 DIAGNOSIS — I10 Essential (primary) hypertension: Secondary | ICD-10-CM | POA: Diagnosis not present

## 2018-01-27 DIAGNOSIS — F909 Attention-deficit hyperactivity disorder, unspecified type: Secondary | ICD-10-CM | POA: Diagnosis not present

## 2018-01-27 DIAGNOSIS — Z6835 Body mass index (BMI) 35.0-35.9, adult: Secondary | ICD-10-CM | POA: Diagnosis not present

## 2018-01-27 DIAGNOSIS — Z1389 Encounter for screening for other disorder: Secondary | ICD-10-CM | POA: Diagnosis not present

## 2018-01-27 DIAGNOSIS — F419 Anxiety disorder, unspecified: Secondary | ICD-10-CM | POA: Diagnosis not present

## 2018-01-27 DIAGNOSIS — G894 Chronic pain syndrome: Secondary | ICD-10-CM | POA: Diagnosis not present

## 2018-01-27 DIAGNOSIS — M1991 Primary osteoarthritis, unspecified site: Secondary | ICD-10-CM | POA: Diagnosis not present

## 2018-03-12 DIAGNOSIS — I8393 Asymptomatic varicose veins of bilateral lower extremities: Secondary | ICD-10-CM | POA: Diagnosis not present

## 2018-03-12 DIAGNOSIS — F419 Anxiety disorder, unspecified: Secondary | ICD-10-CM | POA: Diagnosis not present

## 2018-03-12 DIAGNOSIS — G894 Chronic pain syndrome: Secondary | ICD-10-CM | POA: Diagnosis not present

## 2018-03-12 DIAGNOSIS — R600 Localized edema: Secondary | ICD-10-CM | POA: Diagnosis not present

## 2018-03-12 DIAGNOSIS — Z1389 Encounter for screening for other disorder: Secondary | ICD-10-CM | POA: Diagnosis not present

## 2018-03-12 DIAGNOSIS — M1991 Primary osteoarthritis, unspecified site: Secondary | ICD-10-CM | POA: Diagnosis not present

## 2018-03-24 ENCOUNTER — Other Ambulatory Visit: Payer: Self-pay

## 2018-03-24 DIAGNOSIS — I83893 Varicose veins of bilateral lower extremities with other complications: Secondary | ICD-10-CM

## 2018-04-12 ENCOUNTER — Encounter: Payer: PRIVATE HEALTH INSURANCE | Admitting: Vascular Surgery

## 2018-04-22 DIAGNOSIS — Z6835 Body mass index (BMI) 35.0-35.9, adult: Secondary | ICD-10-CM | POA: Diagnosis not present

## 2018-04-22 DIAGNOSIS — Z01419 Encounter for gynecological examination (general) (routine) without abnormal findings: Secondary | ICD-10-CM | POA: Diagnosis not present

## 2018-04-22 DIAGNOSIS — Z6836 Body mass index (BMI) 36.0-36.9, adult: Secondary | ICD-10-CM | POA: Diagnosis not present

## 2018-05-21 DIAGNOSIS — H2511 Age-related nuclear cataract, right eye: Secondary | ICD-10-CM | POA: Diagnosis not present

## 2018-05-21 DIAGNOSIS — Z961 Presence of intraocular lens: Secondary | ICD-10-CM | POA: Diagnosis not present

## 2018-05-21 DIAGNOSIS — H04123 Dry eye syndrome of bilateral lacrimal glands: Secondary | ICD-10-CM | POA: Diagnosis not present

## 2018-05-28 ENCOUNTER — Encounter: Payer: PRIVATE HEALTH INSURANCE | Admitting: Vascular Surgery

## 2018-05-28 ENCOUNTER — Encounter (HOSPITAL_COMMUNITY): Payer: PRIVATE HEALTH INSURANCE

## 2018-06-04 ENCOUNTER — Encounter (HOSPITAL_COMMUNITY): Payer: PRIVATE HEALTH INSURANCE

## 2018-06-04 ENCOUNTER — Encounter: Payer: PRIVATE HEALTH INSURANCE | Admitting: Vascular Surgery

## 2018-06-09 DIAGNOSIS — I10 Essential (primary) hypertension: Secondary | ICD-10-CM | POA: Diagnosis not present

## 2018-06-09 DIAGNOSIS — Z1389 Encounter for screening for other disorder: Secondary | ICD-10-CM | POA: Diagnosis not present

## 2018-06-09 DIAGNOSIS — F909 Attention-deficit hyperactivity disorder, unspecified type: Secondary | ICD-10-CM | POA: Diagnosis not present

## 2018-06-09 DIAGNOSIS — F419 Anxiety disorder, unspecified: Secondary | ICD-10-CM | POA: Diagnosis not present

## 2018-06-09 DIAGNOSIS — Z6834 Body mass index (BMI) 34.0-34.9, adult: Secondary | ICD-10-CM | POA: Diagnosis not present

## 2018-06-24 DIAGNOSIS — Z6835 Body mass index (BMI) 35.0-35.9, adult: Secondary | ICD-10-CM | POA: Diagnosis not present

## 2018-06-24 DIAGNOSIS — G894 Chronic pain syndrome: Secondary | ICD-10-CM | POA: Diagnosis not present

## 2018-06-24 DIAGNOSIS — I10 Essential (primary) hypertension: Secondary | ICD-10-CM | POA: Diagnosis not present

## 2018-06-24 DIAGNOSIS — S23420A Sprain of sternoclavicular (joint) (ligament), initial encounter: Secondary | ICD-10-CM | POA: Diagnosis not present

## 2018-06-24 DIAGNOSIS — M940 Chondrocostal junction syndrome [Tietze]: Secondary | ICD-10-CM | POA: Diagnosis not present

## 2018-07-05 DIAGNOSIS — S0501XA Injury of conjunctiva and corneal abrasion without foreign body, right eye, initial encounter: Secondary | ICD-10-CM | POA: Diagnosis not present

## 2018-07-21 ENCOUNTER — Other Ambulatory Visit (HOSPITAL_COMMUNITY): Payer: Self-pay | Admitting: Internal Medicine

## 2018-07-21 DIAGNOSIS — S4360XA Sprain of unspecified sternoclavicular joint, initial encounter: Secondary | ICD-10-CM

## 2018-08-02 ENCOUNTER — Ambulatory Visit (HOSPITAL_COMMUNITY)
Admission: RE | Admit: 2018-08-02 | Discharge: 2018-08-02 | Disposition: A | Discharge status: Home / Self Care | Payer: BLUE CROSS/BLUE SHIELD | Source: Ambulatory Visit | Attending: Internal Medicine | Admitting: Internal Medicine

## 2018-08-02 DIAGNOSIS — S23420A Sprain of sternoclavicular (joint) (ligament), initial encounter: Secondary | ICD-10-CM | POA: Diagnosis not present

## 2018-08-02 DIAGNOSIS — S4360XA Sprain of unspecified sternoclavicular joint, initial encounter: Secondary | ICD-10-CM

## 2018-08-02 DIAGNOSIS — I700 Atherosclerosis of aorta: Secondary | ICD-10-CM | POA: Diagnosis not present

## 2018-08-02 DIAGNOSIS — R079 Chest pain, unspecified: Secondary | ICD-10-CM | POA: Diagnosis not present

## 2018-08-02 DIAGNOSIS — E042 Nontoxic multinodular goiter: Secondary | ICD-10-CM | POA: Diagnosis not present

## 2018-08-02 DIAGNOSIS — X58XXXA Exposure to other specified factors, initial encounter: Secondary | ICD-10-CM | POA: Diagnosis not present

## 2018-08-04 ENCOUNTER — Other Ambulatory Visit (HOSPITAL_COMMUNITY): Payer: Self-pay | Admitting: Internal Medicine

## 2018-08-04 DIAGNOSIS — E042 Nontoxic multinodular goiter: Secondary | ICD-10-CM

## 2018-08-10 ENCOUNTER — Ambulatory Visit (HOSPITAL_COMMUNITY)
Admission: RE | Admit: 2018-08-10 | Discharge: 2018-08-10 | Disposition: A | Discharge status: Home / Self Care | Payer: BLUE CROSS/BLUE SHIELD | Source: Ambulatory Visit | Attending: Internal Medicine | Admitting: Internal Medicine

## 2018-08-10 DIAGNOSIS — E042 Nontoxic multinodular goiter: Secondary | ICD-10-CM | POA: Insufficient documentation

## 2018-09-07 DIAGNOSIS — Z1231 Encounter for screening mammogram for malignant neoplasm of breast: Secondary | ICD-10-CM | POA: Diagnosis not present

## 2018-09-14 DIAGNOSIS — G894 Chronic pain syndrome: Secondary | ICD-10-CM | POA: Diagnosis not present

## 2018-09-14 DIAGNOSIS — M1991 Primary osteoarthritis, unspecified site: Secondary | ICD-10-CM | POA: Diagnosis not present

## 2018-09-14 DIAGNOSIS — Z6835 Body mass index (BMI) 35.0-35.9, adult: Secondary | ICD-10-CM | POA: Diagnosis not present

## 2018-09-14 DIAGNOSIS — Z23 Encounter for immunization: Secondary | ICD-10-CM | POA: Diagnosis not present

## 2018-09-14 DIAGNOSIS — G4700 Insomnia, unspecified: Secondary | ICD-10-CM | POA: Diagnosis not present

## 2018-09-24 ENCOUNTER — Encounter (HOSPITAL_COMMUNITY): Payer: BLUE CROSS/BLUE SHIELD

## 2018-09-24 ENCOUNTER — Encounter: Payer: Self-pay | Admitting: Vascular Surgery

## 2018-10-05 IMAGING — US US THYROID
1 series · 13 of 25 positions shown · non-contrast
Comparison: 04/05/2013

CLINICAL DATA: Thyroid nodules

EXAM:
THYROID ULTRASOUND
TECHNIQUE: Ultrasound examination of the thyroid gland and adjacent soft
tissues was performed.

[Series 1: us thyroid · 13 of 150 slices shown]
[im 1/150]
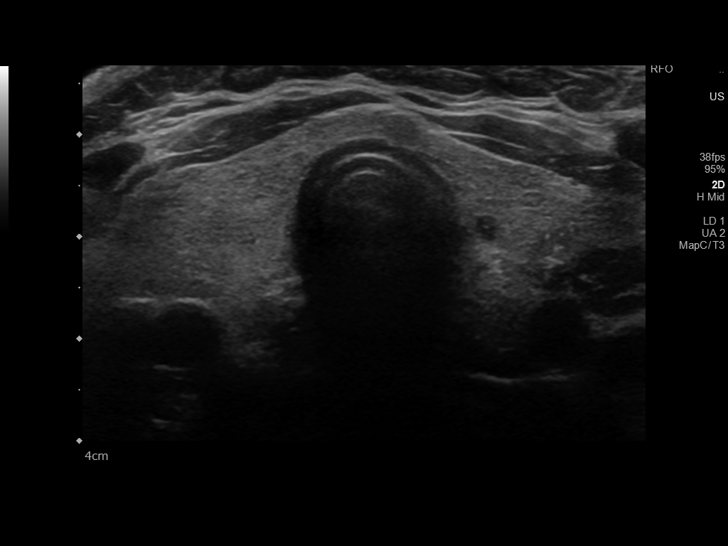
[im 13/150]
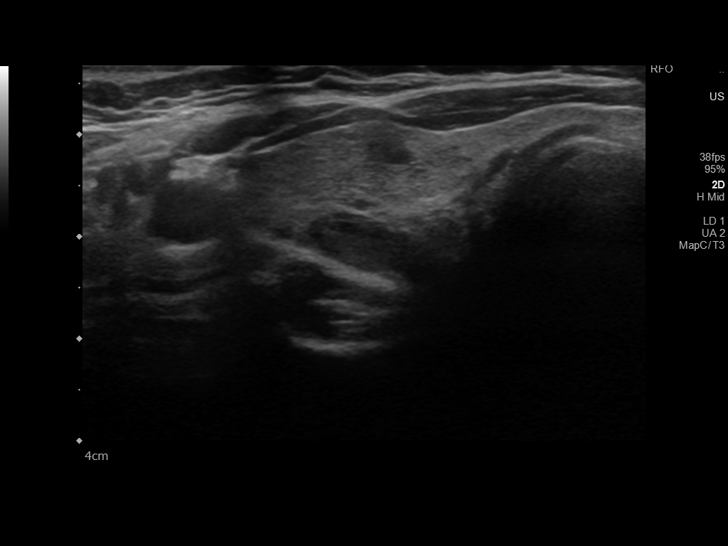
[im 25/150]
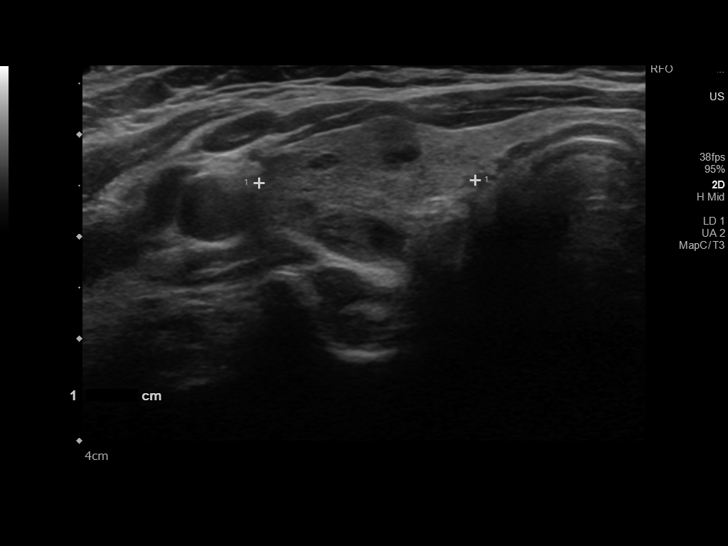
[im 38/150]
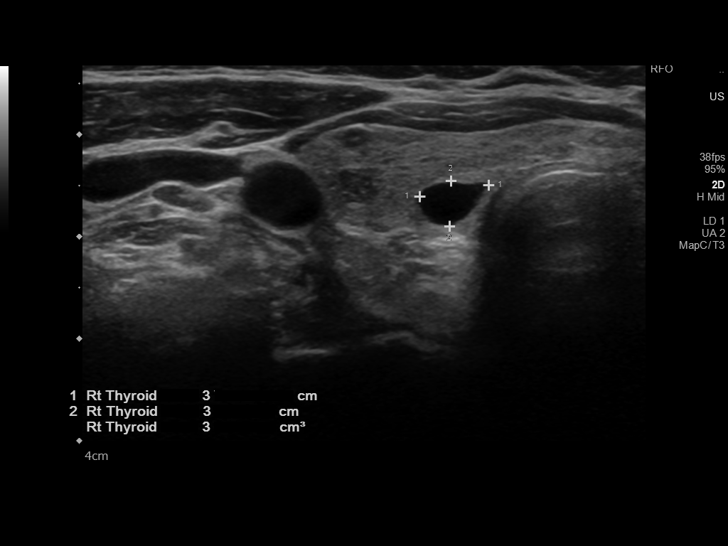
[im 50/150]
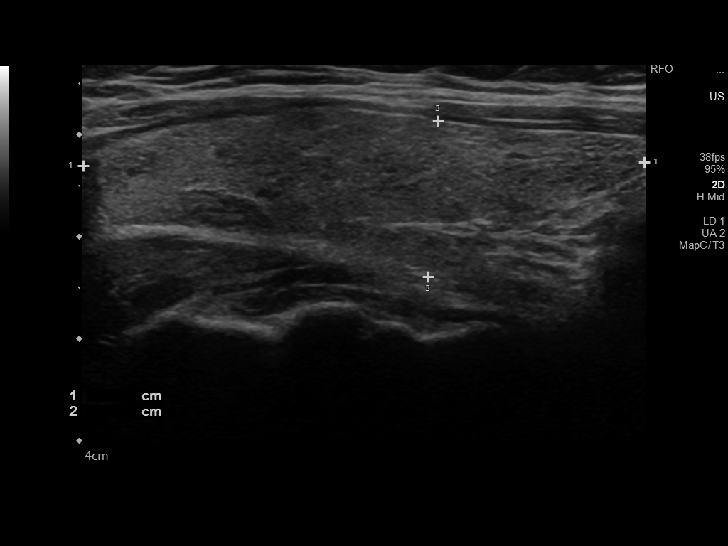
[im 63/150]
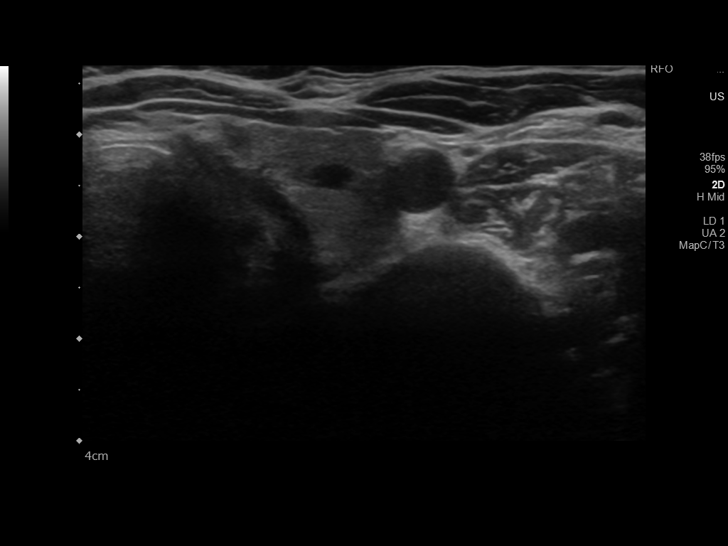
[im 75/150]
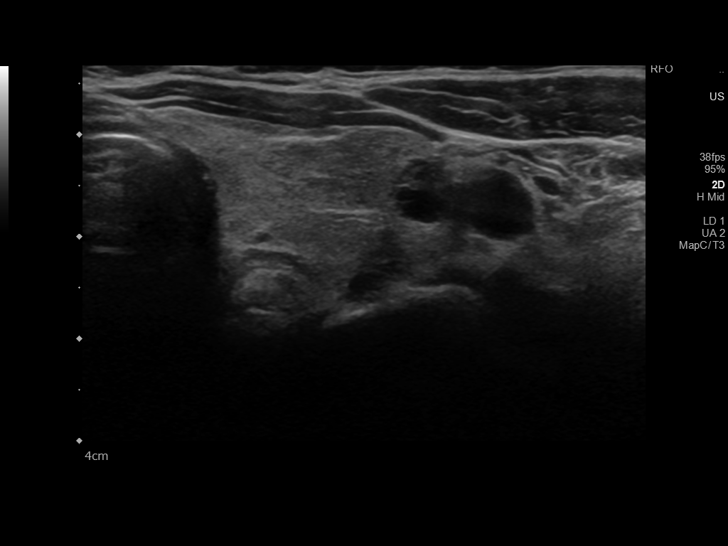
[im 87/150]
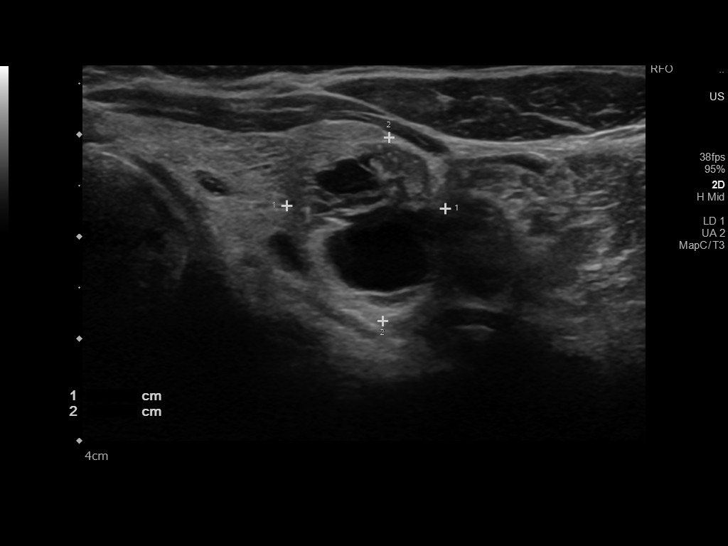
[im 100/150]
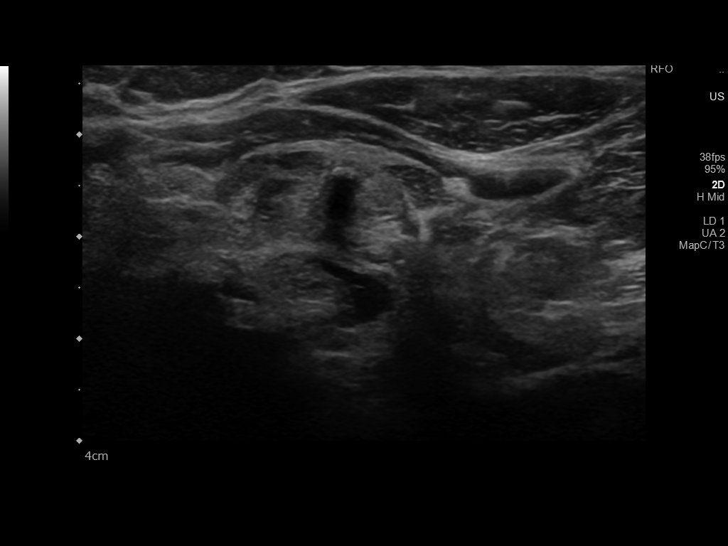
[im 112/150]
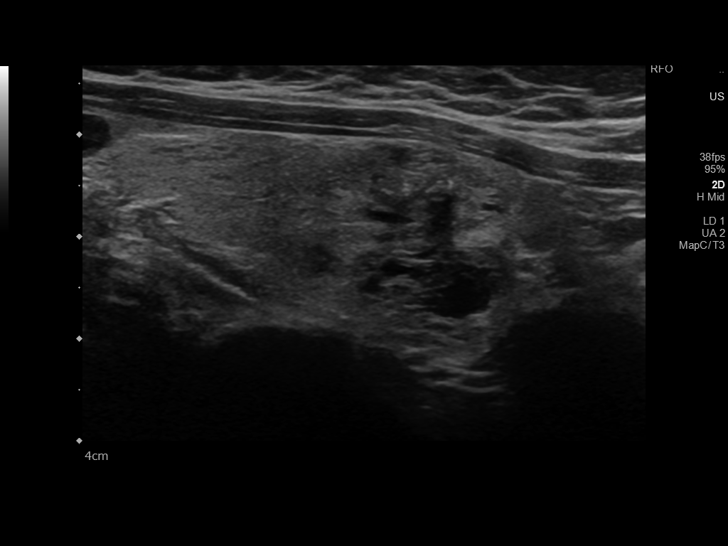
[im 125/150]
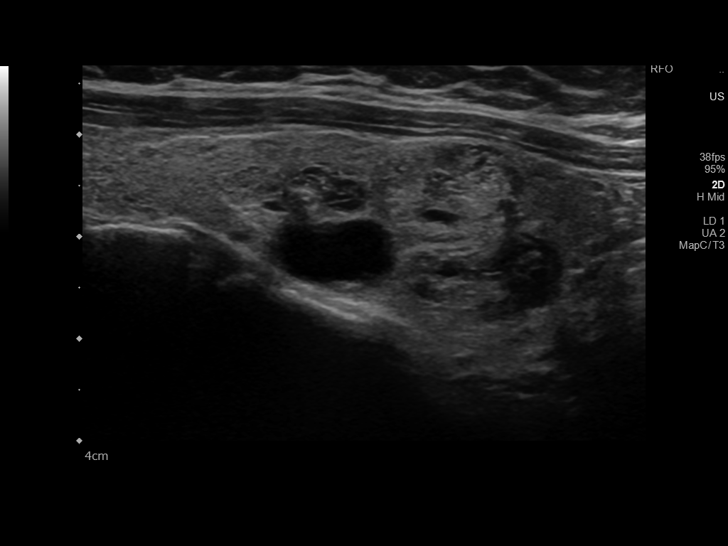
[im 137/150]
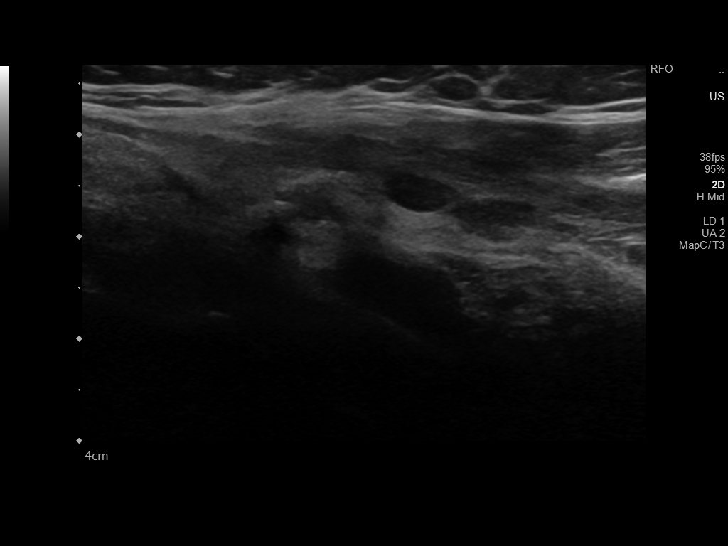
[im 150/150]
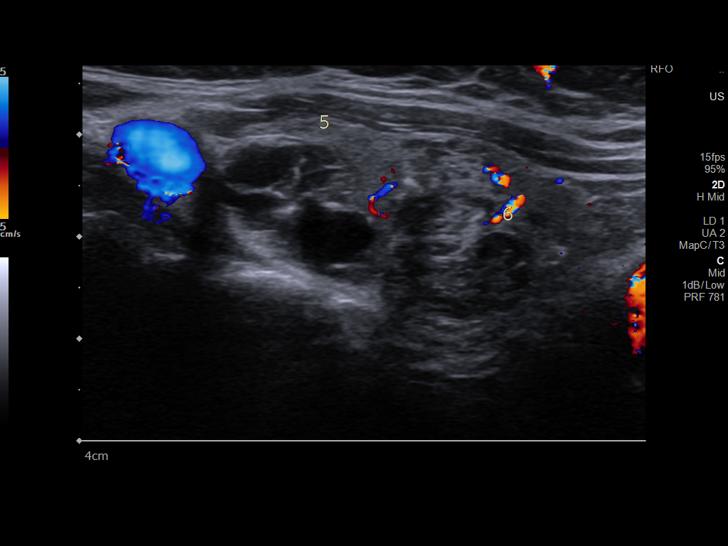

[13 of 25 positions shown; findings below may reference images not displayed]

FINDINGS: Parenchymal Echotexture: Mildly heterogenous

Isthmus: 2.6 mm

Right lobe: 5.5 x 1.5 x 2.1 cm, previously 5.8 x 2.1 x 1.9 cm

Left lobe: 5.6 x 2.0 x 2.4 cm, previously 5.9 x 2.1 x 2.5 cm

_________________________________________________________

Estimated total number of nodules >/= 1 cm: 3

Number of spongiform nodules >/=  2 cm not described below (TR1): 0

Number of mixed cystic and solid nodules >/= 1.5 cm not described
below (TR2): 1

_________________________________________________________

Nodule # 2:

Location: Right; Mid

Maximum size: 1.0 cm; Other 2 dimensions: 0.4 x 0.9 cm

Composition: solid/almost completely solid (2)

Echogenicity: hypoechoic (2)

Shape: not taller-than-wide (0)

Margins: smooth (0)

Echogenic foci: none (0)

ACR TI-RADS total points: 4.

ACR TI-RADS risk category: TR4 (4-6 points).

ACR TI-RADS recommendations:

*Given size (>/= 1 - 1.4 cm) and appearance, a follow-up ultrasound
in 1 year should be considered based on TI-RADS criteria.

_________________________________________________________

The isoechoic predominately solid left inferior thyroid nodule with
macrocalcifications is stable in size measuring 2.2 x 1.8 x 1.6 cm,
previously 2.3 x 2.3 x 1.5 cm. This nodule was previously biopsy
04/14/2013. Correlate with prior pathology.

Additional subcentimeter hypoechoic and cystic nodules noted
bilaterally which would not meet criteria for any biopsy or
follow-up.

There is also a 1.9 cm left mid thyroid mixed cystic/solid nodule
(TR 2 nodule). This nodule would not meet criteria for any biopsy or
follow-up.
IMPRESSION: 1 cm right mid thyroid TR 4 nodule meets criteria follow-up in 1
year.

2.2 cm solid left inferior thyroid nodule previously biopsied is
stable in size. Correlate with prior pathology.

Other nodules noted as above.

The above is in keeping with the ACR TI-RADS recommendations - [HOSPITAL] 3497;[DATE].

## 2018-10-06 ENCOUNTER — Encounter: Payer: Self-pay | Admitting: "Endocrinology

## 2018-10-06 ENCOUNTER — Ambulatory Visit: Payer: BLUE CROSS/BLUE SHIELD | Admitting: "Endocrinology

## 2018-10-06 VITALS — BP 118/83 | HR 79 | Ht 64.0 in | Wt 197.0 lb

## 2018-10-06 DIAGNOSIS — Z6833 Body mass index (BMI) 33.0-33.9, adult: Secondary | ICD-10-CM | POA: Diagnosis not present

## 2018-10-06 DIAGNOSIS — E6609 Other obesity due to excess calories: Secondary | ICD-10-CM | POA: Diagnosis not present

## 2018-10-06 DIAGNOSIS — E042 Nontoxic multinodular goiter: Secondary | ICD-10-CM

## 2018-10-06 NOTE — Progress Notes (Signed)
Endocrinology Consult Note                                            10/06/2018, 3:09 PM   Subjective:    Patient ID: Jasmine Potter, female    DOB: [birth year], PCP Cory Munch, PA-C   Past Medical History:  Diagnosis Date  . ADD (attention deficit disorder)   . Back pain   . Chronic back pain   . Depression   . Hypertension   . OA (osteoarthritis)   . Pneumonia   . Spondylosis    Past Surgical History:  Procedure Laterality Date  . APPENDECTOMY    . CATARACT EXTRACTION W/ INTRAOCULAR LENS IMPLANT     left eye  . CHOLECYSTECTOMY    . COLONOSCOPY W/ BIOPSIES AND POLYPECTOMY    . HEMORRHOID SURGERY    . INTRAOCULAR LENS REMOVAL Left 07/16/2017   Procedure: REMOVAL OF INTRAOCULAR LENS;  Surgeon: Jalene Mullet, MD;  Location: [place];  Service: Ophthalmology;  Laterality: Left;  . LASER PHOTO ABLATION Left 07/16/2017   Procedure: LASER PHOTO ABLATION;  Surgeon: Jalene Mullet, MD;  Location: [place];  Service: Ophthalmology;  Laterality: Left;  . PARS PLANA VITRECTOMY Left 07/16/2017   Procedure: PARS PLANA VITRECTOMY WITH 25 GAUGE WITH PLACEMENT OF INTRAOCULAR LENS;  Surgeon: Jalene Mullet, MD;  Location: [place];  Service: Ophthalmology;  Laterality: Left;  . TUBAL LIGATION     Social History   Socioeconomic History  . Marital status: Single    Spouse name: Not on file  . Number of children: Not on file  . Years of education: Not on file  . Highest education level: Not on file  Occupational History  . Not on file  Social Needs  . Financial resource strain: Not on file  . Food insecurity:    Worry: Not on file    Inability: Not on file  . Transportation needs:    Medical: Not on file    Non-medical: Not on file  Tobacco Use  . Smoking status: Former Smoker    Types: Cigarettes  . Smokeless tobacco: Never Used  . Tobacco comment: quit smoking cigarettes in 2014  Substance and Sexual Activity  . Alcohol use: No  . Drug use: No  . Sexual activity: Not  on file  Lifestyle  . Physical activity:    Days per week: Not on file    Minutes per session: Not on file  . Stress: Not on file  Relationships  . Social connections:    Talks on phone: Not on file    Gets together: Not on file    Attends religious service: Not on file    Active member of club or organization: Not on file    Attends meetings of clubs or organizations: Not on file    Relationship status: Not on file  Other Topics Concern  . Not on file  Social History Narrative   ** Merged History Encounter **       Outpatient Encounter Medications as of 10/06/2018  Medication Sig  . amphetamine-dextroamphetamine (ADDERALL) 20 MG tablet Take 20 mg by mouth 2 (two) times daily.   Marland Kitchen atenolol-chlorthalidone (TENORETIC) 50-25 MG tablet Take 1 tablet by mouth daily.  . fish oil-omega-3 fatty acids 1000 MG capsule Take 2 g by mouth 2 (two) times daily.  Marland Kitchen  OVER THE COUNTER MEDICATION Collagen PO Daily Hair skin Nail PO Daily Reishi mushroom PO Daily  . zolpidem (AMBIEN) 10 MG tablet Take 10 mg by mouth at bedtime as needed for sleep.  Marland Kitchen Oxycodone HCl 10 MG TABS Take 10 mg by mouth every 4 (four) hours as needed.  . [DISCONTINUED] amphetamine-dextroamphetamine (ADDERALL) 20 MG tablet Take 20 mg by mouth daily as needed.  . [DISCONTINUED] artificial tears (LACRILUBE) OINT ophthalmic ointment Place into both eyes every 4 (four) hours as needed for dry eyes. Apply to l eye as needed (Patient not taking: Reported on 12/18/2016)  . [DISCONTINUED] Ascorbic Acid (VITAMIN C PO) Take 1 tablet by mouth daily.  . [DISCONTINUED] atenolol (TENORMIN) 25 MG tablet Take by mouth daily.  . [DISCONTINUED] atenolol-chlorthalidone (TENORETIC) 50-25 MG tablet Take 1 tablet by mouth daily.  . [DISCONTINUED] clonazePAM (KLONOPIN) 1 MG tablet Take 1 mg by mouth 2 (two) times daily as needed for anxiety.  . [DISCONTINUED] docusate sodium (COLACE) 100 MG capsule Take 100 mg by mouth daily as needed for mild  constipation.  . [DISCONTINUED] FLUoxetine (PROZAC) 20 MG capsule Take 60 mg by mouth daily.   . [DISCONTINUED] FLUoxetine (PROZAC) 20 MG tablet Take 60 mg by mouth daily.  . [DISCONTINUED] Omega-3 Fatty Acids (FISH OIL) 1000 MG CAPS Take 2 capsules by mouth 2 (two) times daily.  . [DISCONTINUED] OVER THE COUNTER MEDICATION Take 3 capsules by mouth 2 (two) times daily. REISHI Mushroom  . [DISCONTINUED] oxyCODONE-acetaminophen (PERCOCET) 10-325 MG per tablet Take 1 tablet by mouth every 4 (four) hours as needed for pain.  . [DISCONTINUED] phentermine 37.5 MG capsule Take 37.5 mg by mouth daily.  . [DISCONTINUED] predniSONE (DELTASONE) 20 MG tablet Take 3 tablets (60 mg total) by mouth daily with breakfast. (Patient not taking: Reported on 12/18/2016)  . [DISCONTINUED] valACYclovir (VALTREX) 1000 MG tablet Take 1 tablet (1,000 mg total) by mouth 3 (three) times daily. (Patient not taking: Reported on 12/18/2016)   No facility-administered encounter medications on file as of 10/06/2018.    ALLERGIES: No Known Allergies  VACCINATION STATUS:  There is no immunization history on file for this patient.  HPI Jasmine Potter is 52 y.o. female who presents today with a medical history as above. she is being seen in consultation for multinodular goiter requested by Cory Munch, PA-C.  she is known to have multinodular goiter at least since 2014.  Her most recent thyroid ultrasound on August 10, 2018 showed stable 2.2 cm nodule previously biopsied in the left lobe, along with another nodule measuring 1.9 cm.  A separate nodule on the right lobe measuring 1 cm.   -She denies any prior history of thyroid dysfunction.  She has no family history of thyroid malignancy nor dysfunction.  She denies dysphagia, shortness of breath, nor voice change.   She denies any prior exposure to neck radiation.   She is currently not on any antithyroid medications nor thyroid hormone supplements.  Review of  Systems  Constitutional: no weight gain/loss, no fatigue, no subjective hyperthermia, no subjective hypothermia Eyes: no blurry vision, no xerophthalmia ENT: no sore throat, no nodules palpated in throat, no dysphagia/odynophagia, no hoarseness Cardiovascular: no Chest Pain, no Shortness of Breath, no palpitations, no leg swelling Respiratory: no cough, no SOB Gastrointestinal: no Nausea/Vomiting/Diarhhea Musculoskeletal: no muscle/joint aches Skin: no rashes Neurological: no tremors, no numbness, no tingling, no dizziness Psychiatric: no depression, no anxiety  Objective:    BP 118/83   Pulse 79  Ht 5\' 4"  (1.626 m)   Wt 197 lb (89.4 kg)   LMP 03/06/2013   BMI 33.81 kg/m   Wt Readings from Last 3 Encounters:  10/06/18 197 lb (89.4 kg)  07/16/17 200 lb (90.7 kg)  09/25/13 210 lb (95.3 kg)    Physical Exam  Constitutional:  + Minimally fluctuating body weight, not in acute distress, normal state of mind Eyes: PERRLA, EOMI, no exophthalmos ENT: moist mucous membranes, no thyromegaly, no cervical lymphadenopathy Cardiovascular: normal precordial activity, Regular Rate and Rhythm, no Murmur/Rubs/Gallops Respiratory:  adequate breathing efforts, no gross chest deformity, Clear to auscultation bilaterally Gastrointestinal: abdomen soft, Non -tender, No distension, Bowel Sounds present Musculoskeletal: no gross deformities, strength intact in all four extremities Skin: moist, warm, no rashes Neurological: no tremor with outstretched hands, Deep tendon reflexes normal in all four extremities.  CMP ( most recent) CMP     Component Value Date/Time   NA 136 07/16/2017 1036   K 3.0 (L) 07/16/2017 1036   CL 100 (L) 07/16/2017 1036   CO2 27 07/16/2017 1036   GLUCOSE 93 07/16/2017 1036   BUN 14 07/16/2017 1036   CREATININE 0.62 07/16/2017 1036   CALCIUM 9.7 07/16/2017 1036   PROT 7.5 09/25/2013 1422   ALBUMIN 3.9 09/25/2013 1422   AST 21 09/25/2013 1422   ALT 17 09/25/2013 1422    ALKPHOS 61 09/25/2013 1422   BILITOT 0.3 09/25/2013 1422   GFRNONAA >60 07/16/2017 1036   GFRAA >60 07/16/2017 1036   No recent thyroid function test to review.  August 10, 2018 thyroid ultrasound: Right lobe measuring 5.5 cm (previously 5.8 cm) with a 1 cm well-circumscribed nodule - no suspicious features.  Left lobe measures 5.6 cm (previously 5.9 ) with 2.2 cm previously biopsied nodule which revealed benign findings, and a new 1.9 cm partially cystic nodule, no additional suspicious features.  Assessment & Plan:   1. Multinodular goiter - Aletta R Diguglielmo  is being seen at a kind request of Cory Munch, PA-C. - I have reviewed her available thyroid records and clinically evaluated the patient. - Based on reviews, she has  multinodular goiter, clinically euthyroid.   -However, she will need full profile thyroid function test today . - I did not initiate any new prescriptions today. -Will not require intervention for multinodular goiter at this time.  We need another surveillance sonogram in 1-2 years time.  2. Class 1 obesity due to excess calories without serious comorbidity with body mass index (BMI) of 33.0 to 33.9 in adult -Likely related to positive caloric intake.  She will be offered screening for type 2 diabetes with A1c. -  Suggestion is made for her to avoid simple carbohydrates  from her diet including Cakes, Sweet Desserts / Pastries, Ice Cream, Soda (diet and regular), Sweet Tea, Candies, Chips, Cookies, Store Bought Juices, Alcohol in Excess of  1-2 drinks a day, Artificial Sweeteners, and "Sugar-free" Products. This will help patient to have stable blood glucose profile and potentially avoid unintended weight gain.   - Time spent with the patient: 35 minutes, of which >50% was spent in obtaining information about her symptoms, reviewing her previous labs, evaluations, and treatments, counseling her about her multinodular goiter, obesity, and developing a plan to  confirm the diagnosis and long term treatment as necessary.  [place] participated in the discussions, expressed understanding, and voiced agreement with the above plans.  All questions were answered to her satisfaction. she is encouraged to contact clinic should  she have any questions or concerns prior to her return visit.  Follow up plan: Return in about 10 days (around 10/16/2018) for Labs Today- Non-Fasting Ok.   Glade Lloyd, MD [facility] [address] [place], [place] [address] Phone: [phone]  Fax: [phone]     10/06/2018, 3:09 PM  This note was partially dictated with voice recognition software. Similar sounding words can be transcribed inadequately or may not  be corrected upon review.

## 2018-10-08 LAB — T4, FREE: FREE T4: 1.5 ng/dL (ref 0.8–1.8)

## 2018-10-08 LAB — COMPLETE METABOLIC PANEL WITH GFR
AG Ratio: 1.6 (calc) (ref 1.0–2.5)
ALBUMIN MSPROF: 4.4 g/dL (ref 3.6–5.1)
ALKALINE PHOSPHATASE (APISO): 40 U/L (ref 33–130)
ALT: 33 U/L — AB (ref 6–29)
AST: 42 U/L — ABNORMAL HIGH (ref 10–35)
BUN: 9 mg/dL (ref 7–25)
CO2: 30 mmol/L (ref 20–32)
CREATININE: 0.59 mg/dL (ref 0.50–1.05)
Calcium: 10.6 mg/dL — ABNORMAL HIGH (ref 8.6–10.4)
Chloride: 95 mmol/L — ABNORMAL LOW (ref 98–110)
GFR, Est African American: 120 mL/min/{1.73_m2} (ref >=60)
GFR, Est Non African American: 104 mL/min/{1.73_m2} (ref >=60)
GLUCOSE: 78 mg/dL (ref 65–99)
Globulin: 2.7 g/dL (calc) (ref 1.9–3.7)
Potassium: 4.3 mmol/L (ref 3.5–5.3)
Sodium: 137 mmol/L (ref 135–146)
TOTAL PROTEIN: 7.1 g/dL (ref 6.1–8.1)
Total Bilirubin: 0.5 mg/dL (ref 0.2–1.2)

## 2018-10-08 LAB — THYROID PEROXIDASE ANTIBODY: Thyroperoxidase Ab SerPl-aCnc: 1 IU/mL (ref <9)

## 2018-10-08 LAB — HEMOGLOBIN A1C
EAG (MMOL/L): 5.7 (calc)
HEMOGLOBIN A1C: 5.2 %{Hb} (ref <5.7)
Mean Plasma Glucose: 103 (calc)

## 2018-10-08 LAB — VITAMIN D 25 HYDROXY (VIT D DEFICIENCY, FRACTURES): Vit D, 25-Hydroxy: 108 ng/mL — ABNORMAL HIGH (ref 30–100)

## 2018-10-08 LAB — THYROGLOBULIN ANTIBODY: Thyroglobulin Ab: <1 IU/mL (ref <=1)

## 2018-10-08 LAB — T3, FREE: T3, Free: 3.7 pg/mL (ref 2.3–4.2)

## 2018-10-08 LAB — TSH: TSH: 0.34 m[IU]/L — AB

## 2018-10-25 ENCOUNTER — Ambulatory Visit: Payer: BLUE CROSS/BLUE SHIELD | Admitting: "Endocrinology

## 2018-11-12 ENCOUNTER — Encounter (HOSPITAL_COMMUNITY): Payer: BLUE CROSS/BLUE SHIELD

## 2018-11-12 ENCOUNTER — Encounter: Payer: Self-pay | Admitting: Vascular Surgery

## 2018-11-18 ENCOUNTER — Ambulatory Visit (INDEPENDENT_AMBULATORY_CARE_PROVIDER_SITE_OTHER): Payer: BLUE CROSS/BLUE SHIELD | Admitting: "Endocrinology

## 2018-11-18 ENCOUNTER — Encounter: Payer: Self-pay | Admitting: "Endocrinology

## 2018-11-18 VITALS — BP 118/78 | HR 66 | Ht 64.0 in | Wt 186.0 lb

## 2018-11-18 DIAGNOSIS — E042 Nontoxic multinodular goiter: Secondary | ICD-10-CM

## 2018-11-18 DIAGNOSIS — E0590 Thyrotoxicosis, unspecified without thyrotoxic crisis or storm: Secondary | ICD-10-CM | POA: Diagnosis not present

## 2018-11-18 DIAGNOSIS — R7309 Other abnormal glucose: Secondary | ICD-10-CM | POA: Diagnosis not present

## 2018-11-18 NOTE — Progress Notes (Signed)
Endocrinology follow up Note                                            11/18/2018, 4:48 PM   Subjective:    Patient ID: Jasmine Potter, female    DOB: Mar 21, 1966, PCP Jasmine Munch, PA-C   Past Medical History:  Diagnosis Date  . ADD (attention deficit disorder)   . Back pain   . Chronic back pain   . Depression   . Hypertension   . OA (osteoarthritis)   . Pneumonia   . Spondylosis    Past Surgical History:  Procedure Laterality Date  . APPENDECTOMY    . CATARACT EXTRACTION W/ INTRAOCULAR LENS IMPLANT     left eye  . CHOLECYSTECTOMY    . COLONOSCOPY W/ BIOPSIES AND POLYPECTOMY    . HEMORRHOID SURGERY    . INTRAOCULAR LENS REMOVAL Left 07/16/2017   Procedure: REMOVAL OF INTRAOCULAR LENS;  Surgeon: Jalene Mullet, MD;  Location: [place];  Service: Ophthalmology;  Laterality: Left;  . LASER PHOTO ABLATION Left 07/16/2017   Procedure: LASER PHOTO ABLATION;  Surgeon: Jalene Mullet, MD;  Location: [place];  Service: Ophthalmology;  Laterality: Left;  . PARS PLANA VITRECTOMY Left 07/16/2017   Procedure: PARS PLANA VITRECTOMY WITH 25 GAUGE WITH PLACEMENT OF INTRAOCULAR LENS;  Surgeon: Jalene Mullet, MD;  Location: [place];  Service: Ophthalmology;  Laterality: Left;  . TUBAL LIGATION     Social History   Socioeconomic History  . Marital status: Single    Spouse name: Not on file  . Number of children: Not on file  . Years of education: Not on file  . Highest education level: Not on file  Occupational History  . Not on file  Social Needs  . Financial resource strain: Not on file  . Food insecurity:    Worry: Not on file    Inability: Not on file  . Transportation needs:    Medical: Not on file    Non-medical: Not on file  Tobacco Use  . Smoking status: Former Smoker    Types: Cigarettes  . Smokeless tobacco: Never Used  . Tobacco comment: quit smoking cigarettes in 2014  Substance and Sexual Activity  . Alcohol use: No  . Drug use: No  . Sexual activity: Not  on file  Lifestyle  . Physical activity:    Days per week: Not on file    Minutes per session: Not on file  . Stress: Not on file  Relationships  . Social connections:    Talks on phone: Not on file    Gets together: Not on file    Attends religious service: Not on file    Active member of club or organization: Not on file    Attends meetings of clubs or organizations: Not on file    Relationship status: Not on file  Other Topics Concern  . Not on file  Social History Narrative   ** Merged History Encounter **       Outpatient Encounter Medications as of 11/18/2018  Medication Sig  . amphetamine-dextroamphetamine (ADDERALL) 20 MG tablet Take 20 mg by mouth 2 (two) times daily.   Marland Kitchen atenolol-chlorthalidone (TENORETIC) 50-25 MG tablet Take 1 tablet by mouth daily.  . fish oil-omega-3 fatty acids 1000 MG capsule Take 2 g by mouth 2 (two) times daily.  Marland Kitchen  OVER THE COUNTER MEDICATION Collagen PO Daily Hair skin Nail PO Daily Reishi mushroom PO Daily  . Oxycodone HCl 10 MG TABS Take 10 mg by mouth every 4 (four) hours as needed.  . zolpidem (AMBIEN) 10 MG tablet Take 10 mg by mouth at bedtime as needed for sleep.   No facility-administered encounter medications on file as of 11/18/2018.    ALLERGIES: No Known Allergies  VACCINATION STATUS:  There is no immunization history on file for this patient.  HPI Jasmine Potter is 53 y.o. female who presents today with repeat thyroid function test.  She was seen in consultation for multinodular goiter requested by Jasmine Munch, PA-C.  she is known to have multinodular goiter at least since 2014.  Her most recent thyroid ultrasound on August 10, 2018 showed stable 2.2 cm nodule previously biopsied in the left lobe, along with another nodule measuring 1.9 cm.  A separate nodule on the right lobe measuring 1 cm.   -She denies any prior history of thyroid dysfunction.  She has no family history of thyroid malignancy nor dysfunction.  She denies  dysphagia, shortness of breath, nor voice change.   She denies any prior exposure to neck radiation.   She is currently not on any antithyroid medications nor thyroid hormone supplements.  Review of Systems  Constitutional: + She may some changes in her diet lost 10 pounds since November.   no fatigue, no subjective hyperthermia, no subjective hypothermia Eyes: no blurry vision, no xerophthalmia ENT: no sore throat, no nodules palpated in throat, no dysphagia/odynophagia, no hoarseness Cardiovascular: No chest pain, no palpitations.   Musculoskeletal: no muscle/joint aches Skin: no rashes Neurological: no tremors, no numbness, no tingling, no dizziness Psychiatric: no depression, no anxiety  Objective:    BP 118/78   Pulse 66   Ht 5\' 4"  (1.626 m)   Wt 186 lb (84.4 kg)   LMP 03/06/2013   BMI 31.93 kg/m   Wt Readings from Last 3 Encounters:  11/18/18 186 lb (84.4 kg)  10/06/18 197 lb (89.4 kg)  07/16/17 200 lb (90.7 kg)    Physical Exam  Constitutional:  + Obese for height,  not in acute distress, normal state of mind Eyes: PERRLA, EOMI, no exophthalmos ENT: moist mucous membranes, no thyromegaly, no cervical lymphadenopathy Musculoskeletal: no gross deformities, strength intact in all four extremities Skin: moist, warm, no rashes Neurological: no tremor with outstretched hands, Deep tendon reflexes normal in all four extremities.  CMP ( most recent) CMP     Component Value Date/Time   NA 137 10/06/2018 0946   K 4.3 10/06/2018 0946   CL 95 (L) 10/06/2018 0946   CO2 30 10/06/2018 0946   GLUCOSE 78 10/06/2018 0946   BUN 9 10/06/2018 0946   CREATININE 0.59 10/06/2018 0946   CALCIUM 10.6 (H) 10/06/2018 0946   PROT 7.1 10/06/2018 0946   ALBUMIN 3.9 09/25/2013 1422   AST 42 (H) 10/06/2018 0946   ALT 33 (H) 10/06/2018 0946   ALKPHOS 61 09/25/2013 1422   BILITOT 0.5 10/06/2018 0946   GFRNONAA 104 10/06/2018 0946   GFRAA 120 10/06/2018 0946   No recent thyroid  function test to review.  August 10, 2018 thyroid ultrasound: Right lobe measuring 5.5 cm (previously 5.8 cm) with a 1 cm well-circumscribed nodule - no suspicious features.  Left lobe measures 5.6 cm (previously 5.9 ) with 2.2 cm previously biopsied nodule which revealed benign findings, and a new 1.9 cm partially cystic nodule, no additional  suspicious features.  Recent Results (from the past 2160 hour(s))  TSH     Status: Abnormal   Collection Time: 10/06/18  9:46 AM  Result Value Ref Range   TSH 0.34 (L) mIU/L    Comment:           Reference Range .           > or = 20 Years  0.40-4.50 .                Pregnancy Ranges           First trimester    0.26-2.66           Second trimester   0.55-2.73           Third trimester    0.43-2.91   T4, free     Status: None   Collection Time: 10/06/18  9:46 AM  Result Value Ref Range   Free T4 1.5 0.8 - 1.8 ng/dL  T3, free     Status: None   Collection Time: 10/06/18  9:46 AM  Result Value Ref Range   T3, Free 3.7 2.3 - 4.2 pg/mL  Thyroid peroxidase antibody     Status: None   Collection Time: 10/06/18  9:46 AM  Result Value Ref Range   Thyroperoxidase Ab SerPl-aCnc 1 <9 IU/mL  Thyroglobulin antibody     Status: None   Collection Time: 10/06/18  9:46 AM  Result Value Ref Range   Thyroglobulin Ab <1 < or = 1 IU/mL  Hemoglobin A1c     Status: None   Collection Time: 10/06/18  9:46 AM  Result Value Ref Range   Hgb A1c MFr Bld 5.2 <5.7 % of total Hgb    Comment: For the purpose of screening for the presence of diabetes: . <5.7%       Consistent with the absence of diabetes 5.7-6.4%    Consistent with increased risk for diabetes             (prediabetes) > or =6.5%  Consistent with diabetes . This assay result is consistent with a decreased risk of diabetes. . Currently, no consensus exists regarding use of hemoglobin A1c for diagnosis of diabetes in children. . According to American Diabetes Association (ADA) guidelines,  hemoglobin A1c <7.0% represents optimal control in non-pregnant diabetic patients. Different metrics may apply to specific patient populations.  Standards of Medical Care in Diabetes(ADA). .    Mean Plasma Glucose 103 (calc)   eAG (mmol/L) 5.7 (calc)  COMPLETE METABOLIC PANEL WITH GFR     Status: Abnormal   Collection Time: 10/06/18  9:46 AM  Result Value Ref Range   Glucose, Bld 78 65 - 99 mg/dL    Comment: .            Fasting reference interval .    BUN 9 7 - 25 mg/dL   Creat 0.59 0.50 - 1.05 mg/dL    Comment: For patients >34 years of age, the reference limit for Creatinine is approximately 13% higher for people identified as African-American. .    GFR, Est Non African American 104 > OR = 60 mL/min/1.38m2   GFR, Est African American 120 > OR = 60 mL/min/1.66m2   BUN/Creatinine Ratio NOT APPLICABLE 6 - 22 (calc)   Sodium 137 135 - 146 mmol/L   Potassium 4.3 3.5 - 5.3 mmol/L   Chloride 95 (L) 98 - 110 mmol/L   CO2 30 20 - 32 mmol/L   Calcium 10.6 (  H) 8.6 - 10.4 mg/dL   Total Protein 7.1 6.1 - 8.1 g/dL   Albumin 4.4 3.6 - 5.1 g/dL   Globulin 2.7 1.9 - 3.7 g/dL (calc)   AG Ratio 1.6 1.0 - 2.5 (calc)   Total Bilirubin 0.5 0.2 - 1.2 mg/dL   Alkaline phosphatase (APISO) 40 33 - 130 U/L   AST 42 (H) 10 - 35 U/L   ALT 33 (H) 6 - 29 U/L  VITAMIN D 25 Hydroxy (Vit-D Deficiency, Fractures)     Status: Abnormal   Collection Time: 10/06/18  9:46 AM  Result Value Ref Range   Vit D, 25-Hydroxy 108 (H) 30 - 100 ng/mL    Comment: Vitamin D Status         25-OH Vitamin D: . Deficiency:                    <20 ng/mL Insufficiency:             20 - 29 ng/mL Optimal:                 > or = 30 ng/mL . For 25-OH Vitamin D testing on patients on  D2-supplementation and patients for whom quantitation  of D2 and D3 fractions is required, the QuestAssureD(TM) 25-OH VIT D, (D2,D3), LC/MS/MS is recommended: order  code [id number] (patients >4yrs). . For more information on this test, go  to: http://education.questdiagnostics.com/faq/FAQ163 (This link is being provided for  informational/educational purposes only.)      Assessment & Plan:   1. Multinodular goiter - Jasmine Potter  is being seen at a kind request of Jasmine Munch, PA-C. - I have reviewed her available thyroid records and clinically evaluated the patient. - Based on review of her recent thyroid function test which showed slightly suppressed TSH consistent with subclinical hyperthyroidism, she will not require antithyroid intervention at this time.   -She will be reevaluated in 6 months with repeat thyroid function tests and clinical exam.    2. Class 1 obesity due to excess calories   -Likely related to positive caloric intake.  Her A1c is 5.2% indicating absence of prediabetes/diabetes.    -  Suggestion is made for her to avoid simple carbohydrates  from her diet including Cakes, Sweet Desserts / Pastries, Ice Cream, Soda (diet and regular), Sweet Tea, Candies, Chips, Cookies, Store Bought Juices, Alcohol in Excess of  1-2 drinks a day, Artificial Sweeteners, and "Sugar-free" Products. This will help patient to have stable blood glucose profile and potentially avoid unintended weight gain.  Her labs incidentally showed supraphysiologic vitamin D at 102.  She will be advised to discontinue any vitamin D supplements.  Follow up plan: Return in about 6 months (around 05/19/2019) for Follow up with Pre-visit Labs.   Jasmine Lloyd, MD [facility] [address] [place], [place] [address] Phone: [phone]  Fax: [phone]     11/18/2018, 4:48 PM  This note was partially dictated with voice recognition software. Similar sounding words can be transcribed inadequately or may not  be corrected upon review.

## 2018-11-24 DIAGNOSIS — M1991 Primary osteoarthritis, unspecified site: Secondary | ICD-10-CM | POA: Diagnosis not present

## 2018-11-24 DIAGNOSIS — F909 Attention-deficit hyperactivity disorder, unspecified type: Secondary | ICD-10-CM | POA: Diagnosis not present

## 2018-11-24 DIAGNOSIS — Z6830 Body mass index (BMI) 30.0-30.9, adult: Secondary | ICD-10-CM | POA: Diagnosis not present

## 2018-11-24 DIAGNOSIS — Z1389 Encounter for screening for other disorder: Secondary | ICD-10-CM | POA: Diagnosis not present

## 2018-11-24 DIAGNOSIS — M2550 Pain in unspecified joint: Secondary | ICD-10-CM | POA: Diagnosis not present

## 2018-11-24 DIAGNOSIS — I10 Essential (primary) hypertension: Secondary | ICD-10-CM | POA: Diagnosis not present

## 2018-12-09 DIAGNOSIS — I10 Essential (primary) hypertension: Secondary | ICD-10-CM | POA: Diagnosis not present

## 2018-12-09 DIAGNOSIS — Z6831 Body mass index (BMI) 31.0-31.9, adult: Secondary | ICD-10-CM | POA: Diagnosis not present

## 2018-12-09 DIAGNOSIS — I7300 Raynaud's syndrome without gangrene: Secondary | ICD-10-CM | POA: Diagnosis not present

## 2018-12-09 DIAGNOSIS — M2550 Pain in unspecified joint: Secondary | ICD-10-CM | POA: Diagnosis not present

## 2018-12-09 DIAGNOSIS — G894 Chronic pain syndrome: Secondary | ICD-10-CM | POA: Diagnosis not present

## 2018-12-09 DIAGNOSIS — G5601 Carpal tunnel syndrome, right upper limb: Secondary | ICD-10-CM | POA: Diagnosis not present

## 2018-12-09 DIAGNOSIS — M1991 Primary osteoarthritis, unspecified site: Secondary | ICD-10-CM | POA: Diagnosis not present

## 2018-12-15 DIAGNOSIS — Z6831 Body mass index (BMI) 31.0-31.9, adult: Secondary | ICD-10-CM | POA: Diagnosis not present

## 2018-12-15 DIAGNOSIS — E6609 Other obesity due to excess calories: Secondary | ICD-10-CM | POA: Diagnosis not present

## 2018-12-15 DIAGNOSIS — G4700 Insomnia, unspecified: Secondary | ICD-10-CM | POA: Diagnosis not present

## 2018-12-15 DIAGNOSIS — G894 Chronic pain syndrome: Secondary | ICD-10-CM | POA: Diagnosis not present

## 2019-01-07 ENCOUNTER — Encounter (HOSPITAL_COMMUNITY): Payer: BLUE CROSS/BLUE SHIELD

## 2019-01-07 ENCOUNTER — Encounter: Payer: Self-pay | Admitting: Vascular Surgery

## 2019-01-17 DIAGNOSIS — I10 Essential (primary) hypertension: Secondary | ICD-10-CM | POA: Diagnosis not present

## 2019-01-17 DIAGNOSIS — Z1389 Encounter for screening for other disorder: Secondary | ICD-10-CM | POA: Diagnosis not present

## 2019-01-17 DIAGNOSIS — G5603 Carpal tunnel syndrome, bilateral upper limbs: Secondary | ICD-10-CM | POA: Diagnosis not present

## 2019-01-17 DIAGNOSIS — M1991 Primary osteoarthritis, unspecified site: Secondary | ICD-10-CM | POA: Diagnosis not present

## 2019-01-17 DIAGNOSIS — F909 Attention-deficit hyperactivity disorder, unspecified type: Secondary | ICD-10-CM | POA: Diagnosis not present

## 2019-01-17 DIAGNOSIS — Z6829 Body mass index (BMI) 29.0-29.9, adult: Secondary | ICD-10-CM | POA: Diagnosis not present

## 2019-01-17 DIAGNOSIS — G894 Chronic pain syndrome: Secondary | ICD-10-CM | POA: Diagnosis not present

## 2019-01-17 DIAGNOSIS — E663 Overweight: Secondary | ICD-10-CM | POA: Diagnosis not present

## 2019-01-19 ENCOUNTER — Encounter: Payer: Self-pay | Admitting: Vascular Surgery

## 2019-01-19 ENCOUNTER — Ambulatory Visit (HOSPITAL_COMMUNITY): Payer: BLUE CROSS/BLUE SHIELD

## 2019-02-09 DIAGNOSIS — Z1389 Encounter for screening for other disorder: Secondary | ICD-10-CM | POA: Diagnosis not present

## 2019-02-09 DIAGNOSIS — M159 Polyosteoarthritis, unspecified: Secondary | ICD-10-CM | POA: Diagnosis not present

## 2019-02-09 DIAGNOSIS — Z6830 Body mass index (BMI) 30.0-30.9, adult: Secondary | ICD-10-CM | POA: Diagnosis not present

## 2019-02-09 DIAGNOSIS — G894 Chronic pain syndrome: Secondary | ICD-10-CM | POA: Diagnosis not present

## 2019-02-09 DIAGNOSIS — F909 Attention-deficit hyperactivity disorder, unspecified type: Secondary | ICD-10-CM | POA: Diagnosis not present

## 2019-02-15 ENCOUNTER — Telehealth: Payer: Self-pay | Admitting: "Endocrinology

## 2019-02-15 DIAGNOSIS — E0590 Thyrotoxicosis, unspecified without thyrotoxic crisis or storm: Secondary | ICD-10-CM

## 2019-02-15 NOTE — Telephone Encounter (Signed)
Lab order sent to [facility]

## 2019-02-24 DIAGNOSIS — M79644 Pain in right finger(s): Secondary | ICD-10-CM | POA: Diagnosis not present

## 2019-02-24 DIAGNOSIS — M79645 Pain in left finger(s): Secondary | ICD-10-CM | POA: Diagnosis not present

## 2019-03-02 ENCOUNTER — Encounter: Payer: Self-pay | Admitting: Vascular Surgery

## 2019-03-02 ENCOUNTER — Encounter (HOSPITAL_COMMUNITY): Payer: BLUE CROSS/BLUE SHIELD

## 2019-03-08 DIAGNOSIS — Z681 Body mass index (BMI) 19 or less, adult: Secondary | ICD-10-CM | POA: Diagnosis not present

## 2019-03-08 DIAGNOSIS — G894 Chronic pain syndrome: Secondary | ICD-10-CM | POA: Diagnosis not present

## 2019-03-08 DIAGNOSIS — F329 Major depressive disorder, single episode, unspecified: Secondary | ICD-10-CM | POA: Diagnosis not present

## 2019-03-08 DIAGNOSIS — F419 Anxiety disorder, unspecified: Secondary | ICD-10-CM | POA: Diagnosis not present

## 2019-04-15 DIAGNOSIS — I10 Essential (primary) hypertension: Secondary | ICD-10-CM | POA: Diagnosis not present

## 2019-04-15 DIAGNOSIS — F329 Major depressive disorder, single episode, unspecified: Secondary | ICD-10-CM | POA: Diagnosis not present

## 2019-05-06 DIAGNOSIS — M1991 Primary osteoarthritis, unspecified site: Secondary | ICD-10-CM | POA: Diagnosis not present

## 2019-05-06 DIAGNOSIS — G894 Chronic pain syndrome: Secondary | ICD-10-CM | POA: Diagnosis not present

## 2019-05-06 DIAGNOSIS — M2550 Pain in unspecified joint: Secondary | ICD-10-CM | POA: Diagnosis not present

## 2019-05-25 ENCOUNTER — Ambulatory Visit: Payer: BLUE CROSS/BLUE SHIELD | Admitting: "Endocrinology

## 2019-06-03 DIAGNOSIS — G894 Chronic pain syndrome: Secondary | ICD-10-CM | POA: Diagnosis not present

## 2019-06-06 DIAGNOSIS — G5603 Carpal tunnel syndrome, bilateral upper limbs: Secondary | ICD-10-CM | POA: Diagnosis not present

## 2019-06-13 ENCOUNTER — Ambulatory Visit: Payer: BC Managed Care – PPO | Admitting: "Endocrinology

## 2019-06-22 DIAGNOSIS — E0590 Thyrotoxicosis, unspecified without thyrotoxic crisis or storm: Secondary | ICD-10-CM | POA: Diagnosis not present

## 2019-06-23 LAB — TSH: TSH: 0.35 u[IU]/mL — ABNORMAL LOW (ref 0.450–4.500)

## 2019-06-23 LAB — T4, FREE: Free T4: 1.46 ng/dL (ref 0.82–1.77)

## 2019-06-29 ENCOUNTER — Encounter: Payer: Self-pay | Admitting: "Endocrinology

## 2019-06-29 ENCOUNTER — Ambulatory Visit (INDEPENDENT_AMBULATORY_CARE_PROVIDER_SITE_OTHER): Payer: BC Managed Care – PPO | Admitting: "Endocrinology

## 2019-06-29 ENCOUNTER — Other Ambulatory Visit: Payer: Self-pay

## 2019-06-29 DIAGNOSIS — E042 Nontoxic multinodular goiter: Secondary | ICD-10-CM | POA: Diagnosis not present

## 2019-06-29 NOTE — Progress Notes (Signed)
06/29/2019, 4:42 PM                                 Endocrinology Telehealth Visit Follow up Note -During COVID -19 Pandemic  I connected with Jasmine Potter on 06/29/2019   by telephone and verified that I am speaking with the correct person using two identifiers. [name], 13-Dec-1965. she has verbally consented to this visit. All issues noted in this document were discussed and addressed. The format was not optimal for physical exam.  Subjective:    Patient ID: Jasmine Potter, female    DOB: 13-Apr-1966, PCP Jasmine Munch, PA-C   Past Medical History:  Diagnosis Date  . ADD (attention deficit disorder)   . Back pain   . Chronic back pain   . Depression   . Hypertension   . OA (osteoarthritis)   . Pneumonia   . Spondylosis    Past Surgical History:  Procedure Laterality Date  . APPENDECTOMY    . CATARACT EXTRACTION W/ INTRAOCULAR LENS IMPLANT     left eye  . CHOLECYSTECTOMY    . COLONOSCOPY W/ BIOPSIES AND POLYPECTOMY    . HEMORRHOID SURGERY    . INTRAOCULAR LENS REMOVAL Left 07/16/2017   Procedure: REMOVAL OF INTRAOCULAR LENS;  Surgeon: Jalene Mullet, MD;  Location: [place];  Service: Ophthalmology;  Laterality: Left;  . LASER PHOTO ABLATION Left 07/16/2017   Procedure: LASER PHOTO ABLATION;  Surgeon: Jalene Mullet, MD;  Location: [place];  Service: Ophthalmology;  Laterality: Left;  . PARS PLANA VITRECTOMY Left 07/16/2017   Procedure: PARS PLANA VITRECTOMY WITH 25 GAUGE WITH PLACEMENT OF INTRAOCULAR LENS;  Surgeon: Jalene Mullet, MD;  Location: [place];  Service: Ophthalmology;  Laterality: Left;  . TUBAL LIGATION     Social History   Socioeconomic History  . Marital status: Single    Spouse name: Not on file  . Number of children: Not on file  . Years of education: Not on file  . Highest education level: Not on file  Occupational History  . Not on file  Social Needs  . Financial resource strain: Not on file   . Food insecurity    Worry: Not on file    Inability: Not on file  . Transportation needs    Medical: Not on file    Non-medical: Not on file  Tobacco Use  . Smoking status: Former Smoker    Types: Cigarettes  . Smokeless tobacco: Never Used  . Tobacco comment: quit smoking cigarettes in 2014  Substance and Sexual Activity  . Alcohol use: No  . Drug use: No  . Sexual activity: Not on file  Lifestyle  . Physical activity    Days per week: Not on file    Minutes per session: Not on file  . Stress: Not on file  Relationships  . Social Herbalist on phone: Not on file    Gets together: Not on file    Attends religious service: Not on file    Active member of club or organization: Not on file    Attends meetings of clubs or organizations: Not  on file    Relationship status: Not on file  Other Topics Concern  . Not on file  Social History Narrative   ** Merged History Encounter **       Outpatient Encounter Medications as of 06/29/2019  Medication Sig  . amphetamine-dextroamphetamine (ADDERALL) 20 MG tablet Take 20 mg by mouth 2 (two) times daily.   Marland Kitchen atenolol-chlorthalidone (TENORETIC) 50-25 MG tablet Take 1 tablet by mouth daily.  . fish oil-omega-3 fatty acids 1000 MG capsule Take 2 g by mouth 2 (two) times daily.  Marland Kitchen OVER THE COUNTER MEDICATION Collagen PO Daily Hair skin Nail PO Daily Reishi mushroom PO Daily  . Oxycodone HCl 10 MG TABS Take 10 mg by mouth every 4 (four) hours as needed.  . zolpidem (AMBIEN) 10 MG tablet Take 10 mg by mouth at bedtime as needed for sleep.   No facility-administered encounter medications on file as of 06/29/2019.    ALLERGIES: No Known Allergies  VACCINATION STATUS:  There is no immunization history on file for this patient.  HPI Jasmine Potter is 53 y.o. female who is engaged in telehealth via telephone with new set of thyroid function tests after she was seen in consultation for multinodular goiter. PCP:  Jasmine Munch, PA-C.   she is known to have multinodular goiter at least since 2014.  Her most recent thyroid ultrasound on August 10, 2018 showed stable 2.2 cm nodule previously biopsied in the left lobe, along with another nodule measuring 1.9 cm.  A separate nodule on the right lobe measuring 1 cm.   -She was also found to have mildly suppressed TSH with did not require any intervention.  She has no new complaints today.    -  She has no family history of thyroid malignancy nor dysfunction.  She denies dysphagia, shortness of breath, nor voice change.   She denies any prior exposure to neck radiation.   She is currently not on any antithyroid medications nor thyroid hormone supplements.  Review of Systems Limited as above. Objective:    LMP 03/06/2013   Wt Readings from Last 3 Encounters:  11/18/18 186 lb (84.4 kg)  10/06/18 197 lb (89.4 kg)  07/16/17 200 lb (90.7 kg)     CMP ( most recent) CMP     Component Value Date/Time   NA 137 10/06/2018 0946   K 4.3 10/06/2018 0946   CL 95 (L) 10/06/2018 0946   CO2 30 10/06/2018 0946   GLUCOSE 78 10/06/2018 0946   BUN 9 10/06/2018 0946   CREATININE 0.59 10/06/2018 0946   CALCIUM 10.6 (H) 10/06/2018 0946   PROT 7.1 10/06/2018 0946   ALBUMIN 3.9 09/25/2013 1422   AST 42 (H) 10/06/2018 0946   ALT 33 (H) 10/06/2018 0946   ALKPHOS 61 09/25/2013 1422   BILITOT 0.5 10/06/2018 0946   GFRNONAA 104 10/06/2018 0946   GFRAA 120 10/06/2018 0946   No recent thyroid function test to review.  August 10, 2018 thyroid ultrasound: Right lobe measuring 5.5 cm (previously 5.8 cm) with a 1 cm well-circumscribed nodule - no suspicious features.  Left lobe measures 5.6 cm (previously 5.9 ) with 2.2 cm previously biopsied nodule which revealed benign findings, and a new 1.9 cm partially cystic nodule, no additional suspicious features.  Recent Results (from the past 2160 hour(s))  T4, Free     Status: None   Collection Time: 06/22/19 12:24 PM  Result  Value Ref Range   Free T4 1.46 0.82 - 1.77  ng/dL  TSH     Status: Abnormal   Collection Time: 06/22/19 12:24 PM  Result Value Ref Range   TSH 0.350 (L) 0.450 - 4.500 uIU/mL     Assessment & Plan:   1. Multinodular goiter  - I have reviewed her available thyroid records and clinically evaluated the patient. - Based on review of her recent thyroid function test  still  showed slightly suppressed TSH consistent with subclinical hyperthyroidism, she will not require antithyroid intervention at this time.   -She will be reevaluated in 6 months with repeat thyroid function tests and and thyroid ultrasound.    2. Class 1 obesity due to excess calories  She will have repeat A1c during her next visit in office.   Her labs incidentally showed supraphysiologic vitamin D at 102, associated with mild hypercalcemia.  She is  advised to discontinue any vitamin D supplements.  She will have repeat measurements for 25-hydroxy vitamin D, and CMP.   Time for this visit: 15 minutes. [place]  participated in the discussions, expressed understanding, and voiced agreement with the above plans.  All questions were answered to her satisfaction. she is encouraged to contact clinic should she have any questions or concerns prior to her return visit.   Follow up plan: Return in about 6 months (around 12/30/2019) for Next Visit A1c in Office, Follow up with Pre-visit Labs, Thyroid / Neck Ultrasound.   Glade Lloyd, MD [facility] [address] [place], [place] [address] Phone: [phone]  Fax: [phone]     06/29/2019, 4:42 PM  This note was partially dictated with voice recognition software. Similar sounding words can be transcribed inadequately or may not  be corrected upon review.

## 2019-07-06 DIAGNOSIS — M2550 Pain in unspecified joint: Secondary | ICD-10-CM | POA: Diagnosis not present

## 2019-07-06 DIAGNOSIS — G894 Chronic pain syndrome: Secondary | ICD-10-CM | POA: Diagnosis not present

## 2019-07-06 DIAGNOSIS — M1991 Primary osteoarthritis, unspecified site: Secondary | ICD-10-CM | POA: Diagnosis not present

## 2019-08-01 DIAGNOSIS — M48061 Spinal stenosis, lumbar region without neurogenic claudication: Secondary | ICD-10-CM | POA: Diagnosis not present

## 2019-08-01 DIAGNOSIS — R2689 Other abnormalities of gait and mobility: Secondary | ICD-10-CM | POA: Diagnosis not present

## 2019-08-01 DIAGNOSIS — M1991 Primary osteoarthritis, unspecified site: Secondary | ICD-10-CM | POA: Diagnosis not present

## 2019-08-01 DIAGNOSIS — G894 Chronic pain syndrome: Secondary | ICD-10-CM | POA: Diagnosis not present

## 2019-08-10 ENCOUNTER — Ambulatory Visit: Payer: BC Managed Care – PPO | Admitting: Podiatry

## 2019-09-06 DIAGNOSIS — Z6832 Body mass index (BMI) 32.0-32.9, adult: Secondary | ICD-10-CM | POA: Diagnosis not present

## 2019-09-06 DIAGNOSIS — Z23 Encounter for immunization: Secondary | ICD-10-CM | POA: Diagnosis not present

## 2019-09-06 DIAGNOSIS — E6609 Other obesity due to excess calories: Secondary | ICD-10-CM | POA: Diagnosis not present

## 2019-09-06 DIAGNOSIS — G894 Chronic pain syndrome: Secondary | ICD-10-CM | POA: Diagnosis not present

## 2019-09-20 ENCOUNTER — Other Ambulatory Visit: Payer: Self-pay

## 2019-09-20 ENCOUNTER — Encounter: Payer: Self-pay | Admitting: Neurology

## 2019-09-20 ENCOUNTER — Ambulatory Visit: Payer: BC Managed Care – PPO | Admitting: Neurology

## 2019-09-20 VITALS — BP 104/70 | HR 60 | Ht 64.0 in | Wt 189.0 lb

## 2019-09-20 DIAGNOSIS — R2689 Other abnormalities of gait and mobility: Secondary | ICD-10-CM | POA: Diagnosis not present

## 2019-09-20 DIAGNOSIS — R351 Nocturia: Secondary | ICD-10-CM | POA: Diagnosis not present

## 2019-09-20 DIAGNOSIS — E669 Obesity, unspecified: Secondary | ICD-10-CM | POA: Diagnosis not present

## 2019-09-20 DIAGNOSIS — M48061 Spinal stenosis, lumbar region without neurogenic claudication: Secondary | ICD-10-CM

## 2019-09-20 DIAGNOSIS — G479 Sleep disorder, unspecified: Secondary | ICD-10-CM

## 2019-09-20 NOTE — Patient Instructions (Signed)
Your neurological exam is nonfocal, I do not see a primary neurological cause of your balance problem. It may be due to a combination of factors that affect your balance.  Certain medications can cause balance issues, this includes Ambien, and narcotic pain medication. Suboptimal hydration with water may also be a contributor and ultimately also sleep disturbance with sleep deprivation may contribute to your balance issues.Please try to hydrate better with water, try to drink 6 to 8 cups of water per day, reduce and limit your caffeine intake to up to 2 servings per day.  As discussed, we will proceed with a brain MRI to rule out a structural cause of your balance issues.  In addition, we will do a sleep study to rule out underlying obstructive sleep apnea.  If you have OSA, I would recommend treatment with a CPAP machine.

## 2019-09-20 NOTE — Progress Notes (Signed)
Epworth Sleepiness Scale 0= would never doze 1= slight chance of dozing 2= moderate chance of dozing 3= high chance of dozing  Sitting and reading: 3 Watching TV: 3 Sitting inactive in a public place (ex. Theater or meeting): 3 As a passenger in a car for an hour without a break: 2 Lying down to rest in the afternoon: 3 Sitting and talking to someone: 3 Sitting quietly after lunch (no alcohol): 3 In a car, while stopped in traffic: 3 Total: 23

## 2019-09-20 NOTE — Progress Notes (Signed)
Subjective:    Patient ID: Jasmine Potter is a 53 y.o. female.  HPI     Star Age, MD, PhD [facility] [address] [place], [place] [address]  Dear Dr. Gerarda Fraction,   I saw your patient, Jasmine Potter, upon your kind request to my neurologic clinic today for initial consultation of her gait disorder.  The patient is unaccompanied today.  As you know, Jasmine Potter is a 53 year old right-handed woman with an underlying medical history of spondylosis, lumbar spinal stenosis, osteoarthritis, hypertension, depression, ADD, insomnia, and chronic pain on chronic narcotic pain medication, who reports a longer standing issue with her balance, this has become worse in the recent past.  She feels that she gets pulled into one direction, not consistently feeling lightheaded or vertiginous.  She has not fallen.  She denies any radiating back pain.  She denies any history of one-sided weakness or numbness or tingling.  She does have a history of Bell's palsy.  She does not take her oxycodone on a scheduled basis, averages about 1/day.  She also takes less Adderall than prescribed, reports taking 1 a day.  She admits that she does not hydrate very well with water.  She estimates that she drinks about 2 to 3 cups of water per day, drinks tea about 2 to 3 cups/day and coffee 1 cup/day.  She does not sleep well.  She has a longstanding history of difficulty falling asleep and staying asleep.  She is no longer on trazodone, no longer on clonazepam.  She does take Ambien 10 mg each night.  In addition, she is on oxycodone, and Adderall.  She is on Prozac, no longer on Lexapro.  She sees a Restaurant manager, fast food but has not seen a spine specialist in some years.  She reports having had a lumbar spine MRI, I could not review it. I reviewed your virtual visit office note from 08/01/2019.  She is divorced, she lives alone.  She works as a Quarry manager, she is hoping to get her certification for [employer].  She  currently works as a Neurosurgeon.  She has nocturia about twice per average night.  Her Epworth sleepiness score is 23 out of 24, fatigue severity score is 17 out of 63.  She has never had a sleep study.   Her Past Medical History Is Significant For: Past Medical History:  Diagnosis Date  . ADD (attention deficit disorder)   . Back pain   . Chronic back pain   . Depression   . Hypertension   . OA (osteoarthritis)   . Pneumonia   . Spondylosis     Her Past Surgical History Is Significant For: Past Surgical History:  Procedure Laterality Date  . APPENDECTOMY    . CATARACT EXTRACTION W/ INTRAOCULAR LENS IMPLANT     left eye  . CHOLECYSTECTOMY    . COLONOSCOPY W/ BIOPSIES AND POLYPECTOMY    . HEMORRHOID SURGERY    . INTRAOCULAR LENS REMOVAL Left 07/16/2017   Procedure: REMOVAL OF INTRAOCULAR LENS;  Surgeon: Jalene Mullet, MD;  Location: [place];  Service: Ophthalmology;  Laterality: Left;  . LASER PHOTO ABLATION Left 07/16/2017   Procedure: LASER PHOTO ABLATION;  Surgeon: Jalene Mullet, MD;  Location: [place];  Service: Ophthalmology;  Laterality: Left;  . PARS PLANA VITRECTOMY Left 07/16/2017   Procedure: PARS PLANA VITRECTOMY WITH 25 GAUGE WITH PLACEMENT OF INTRAOCULAR LENS;  Surgeon: Jalene Mullet, MD;  Location: [place];  Service: Ophthalmology;  Laterality:  Left;  . TUBAL LIGATION      Her Family History Is Significant For: Family History  Problem Relation Age of Onset  . Breast cancer Mother   . Heart attack Father   . Leukemia Sister   . Colon cancer Brother   . Breast cancer Sister     Her Social History Is Significant For: Social History   Socioeconomic History  . Marital status: Single    Spouse name: Not on file  . Number of children: Not on file  . Years of education: Not on file  . Highest education level: Not on file  Occupational History  . Not on file  Social Needs  . Financial resource strain: Not on file  . Food insecurity    Worry: Not on file     Inability: Not on file  . Transportation needs    Medical: Not on file    Non-medical: Not on file  Tobacco Use  . Smoking status: Former Smoker    Types: Cigarettes  . Smokeless tobacco: Never Used  . Tobacco comment: quit smoking cigarettes in 2014  Substance and Sexual Activity  . Alcohol use: No  . Drug use: No  . Sexual activity: Not on file  Lifestyle  . Physical activity    Days per week: Not on file    Minutes per session: Not on file  . Stress: Not on file  Relationships  . Social Herbalist on phone: Not on file    Gets together: Not on file    Attends religious service: Not on file    Active member of club or organization: Not on file    Attends meetings of clubs or organizations: Not on file    Relationship status: Not on file  Other Topics Concern  . Not on file  Social History Narrative   ** Merged History Encounter **        Her Allergies Are:  No Known Allergies:   Her Current Medications Are:  Outpatient Encounter Medications as of 09/20/2019  Medication Sig  . amphetamine-dextroamphetamine (ADDERALL) 20 MG tablet Take 20 mg by mouth 2 (two) times daily.   Marland Kitchen atenolol-chlorthalidone (TENORETIC) 50-25 MG tablet Take 1 tablet by mouth daily.  . fish oil-omega-3 fatty acids 1000 MG capsule Take 2 g by mouth 2 (two) times daily.  Marland Kitchen FLUoxetine (PROZAC) 20 MG capsule Take 20 mg by mouth daily.  Marland Kitchen OVER THE COUNTER MEDICATION Collagen PO Daily Hair skin Nail PO Daily Reishi mushroom PO Daily  . Oxycodone HCl 10 MG TABS Take 10 mg by mouth every 4 (four) hours as needed.  . zolpidem (AMBIEN) 10 MG tablet Take 10 mg by mouth at bedtime as needed for sleep.  . [DISCONTINUED] escitalopram (LEXAPRO) 10 MG tablet Take 10 mg by mouth daily.   No facility-administered encounter medications on file as of 09/20/2019.   :   Review of Systems:  Out of a complete 14 point review of systems, all are reviewed and negative with the exception of these  symptoms as listed below:  Review of Systems  Neurological:       Pt presents today to discuss feeling "off balance."    Objective:  Neurological Exam  Physical Exam Physical Examination:   Vitals:   09/20/19 1445  BP: 104/70  Pulse: 60    General Examination: The patient is a very pleasant 53 y.o. female in no acute distress. She appears well-developed and well-nourished and well groomed.  HEENT: Normocephalic, atraumatic, pupils are equal, round and reactive to light. Extraocular tracking is well preserved, face is symmetric with normal facial animation and normal facial sensation, speech is clear without dysarthria, hypophonia or voice tremor, neck is supple, no carotid bruits.  Airway examination reveals moderate to severe mouth dryness, adequate dental hygiene, small airway entry, Mallampati class I, tonsils are small, tongue protrudes centrally in palate elevates symmetrically.  Chest: Clear to auscultation without wheezing, rhonchi or crackles noted.  Heart: S1+S2+0, regular and normal without murmurs, rubs or gallops noted.   Abdomen: Soft, non-tender and non-distended with normal bowel sounds appreciated on auscultation.  Extremities: There is no pitting edema in the distal lower extremities bilaterally. Pedal pulses are intact.  Skin: Warm and dry without trophic changes noted. There are varicose and spider veins in the legs.  Musculoskeletal: exam reveals no obvious joint deformities, tenderness or joint swelling or erythema.   Neurologically:  Mental status: The patient is awake, alert and oriented in all 4 spheres. Her immediate and remote memory, attention, language skills and fund of knowledge are appropriate. There is no evidence of aphasia, agnosia, apraxia or anomia. Speech is clear with normal prosody and enunciation. Thought process is linear. Mood is normal and affect is normal.  Cranial nerves II - XII are as described above under HEENT exam. In addition:  Left shoulder is slightly higher than right shoulder, she also has increase in thoracic kyphosis. ?scoliosis. Motor exam: Normal bulk, strength and tone is noted. There is no drift, tremor or rebound. Romberg is negative. Reflexes are 2+ throughout. Babinski: Toes are flexor bilaterally. Fine motor skills and coordination: intact with normal finger taps, normal hand movements, normal rapid alternating patting, normal foot taps and normal foot agility.  Cerebellar testing: No dysmetria or intention tremor on finger to nose testing. Heel to shin is unremarkable bilaterally. There is no truncal or gait ataxia.  Sensory exam: intact to light touch, vibration, temperature sense in the upper and lower extremities.  Gait, station and balance: She stands easily. No veering to one side is noted. No leaning to one side is noted. Posture is age-appropriate and stance is narrow based. Gait shows normal stride length and normal pace. No problems turning are noted. tandem walk is unremarkable.   Assessment and Plan:  In summary, Eulis Canner R Szczepaniak is a very pleasant 53 y.o.-year old female  with an underlying medical history of spondylosis, lumbar spinal stenosis, osteoarthritis, hypertension, depression, ADD, insomnia, and chronic pain on chronic narcotic pain medication, who Presents for evaluation of her balance problem.  On examination, she has a nonfocal neurological exam, no evidence of orthostatic lightheadedness, no vertiginous symptoms, no TIA/stroke symptoms.  She does have a history of lumbar spinal stenosis and duration of lower back in years.  She is encouraged to talk to you about seeing a spine specialist.  She is encouraged to talk to you about getting a updated lumbar spine MRI as well.  She may have balance issues secondary to several contributors.  Her sedating medications could be one of her contributors, suboptimal hydration is another possibility or contributor.  In addition, she has chronic sleep  difficulties and is sleepy during the day and chronic sleep deprivation may be another possibility. She is advised to proceed with a sleep study to rule out underlying sleep disordered breathing.  She is encouraged to drink more water and limit her caffeine intake.  Furthermore, we will proceed with a brain MRI with and without contrast to  rule out a structural cause of her balance problems.  We will keep her posted as to her test results by phone call.  I plan to see her back after testing.  I answered all her questions today and she was in agreement. Thank you very much for allowing me to participate in the care of this nice patient. If I can be of any further assistance to you please do not hesitate to call me at [phone].  Sincerely,   Star Age, MD, PhD

## 2019-09-27 ENCOUNTER — Telehealth: Payer: Self-pay

## 2019-09-27 NOTE — Telephone Encounter (Signed)
I called the patient to schedule MRI but she did not answer and her VM was not set up. DW

## 2019-10-03 DIAGNOSIS — F909 Attention-deficit hyperactivity disorder, unspecified type: Secondary | ICD-10-CM | POA: Diagnosis not present

## 2019-10-03 DIAGNOSIS — G894 Chronic pain syndrome: Secondary | ICD-10-CM | POA: Diagnosis not present

## 2019-10-21 DIAGNOSIS — G894 Chronic pain syndrome: Secondary | ICD-10-CM | POA: Diagnosis not present

## 2019-10-24 ENCOUNTER — Telehealth: Payer: Self-pay

## 2019-10-24 NOTE — Telephone Encounter (Signed)
We have attempted to call the patient two times to schedule sleep study.  Patient has been unavailable at the phone numbers we have on file and has not returned our calls. If patient calls back we will schedule them for their sleep study.  

## 2019-12-16 ENCOUNTER — Ambulatory Visit (HOSPITAL_COMMUNITY): Admission: RE | Admit: 2019-12-16 | Payer: Self-pay | Source: Ambulatory Visit

## 2020-01-03 ENCOUNTER — Ambulatory Visit: Payer: BC Managed Care – PPO | Admitting: "Endocrinology

## 2020-01-16 ENCOUNTER — Ambulatory Visit: Payer: Self-pay | Admitting: "Endocrinology

## 2020-01-17 ENCOUNTER — Ambulatory Visit (HOSPITAL_COMMUNITY)
Admission: RE | Admit: 2020-01-17 | Discharge: 2020-01-17 | Disposition: A | Discharge status: Home / Self Care | Payer: 59 | Source: Ambulatory Visit | Attending: "Endocrinology | Admitting: "Endocrinology

## 2020-01-17 ENCOUNTER — Other Ambulatory Visit: Payer: Self-pay

## 2020-01-17 DIAGNOSIS — E042 Nontoxic multinodular goiter: Secondary | ICD-10-CM | POA: Insufficient documentation

## 2020-01-24 ENCOUNTER — Ambulatory Visit: Payer: Self-pay | Admitting: "Endocrinology

## 2020-01-27 LAB — VITAMIN D 25 HYDROXY (VIT D DEFICIENCY, FRACTURES): Vit D, 25-Hydroxy: 50.1

## 2020-01-27 LAB — BASIC METABOLIC PANEL
BUN: 20 (ref 4–21)
Creatinine: 0.8 (ref 0.5–1.1)

## 2020-01-27 LAB — COMPREHENSIVE METABOLIC PANEL: Calcium: 9.8 (ref 8.7–10.7)

## 2020-01-28 LAB — TSH: TSH: 0.33 — AB (ref 0.41–5.90)

## 2020-01-31 ENCOUNTER — Telehealth (HOSPITAL_COMMUNITY): Payer: Self-pay

## 2020-01-31 ENCOUNTER — Other Ambulatory Visit: Payer: Self-pay | Admitting: *Deleted

## 2020-01-31 DIAGNOSIS — I83893 Varicose veins of bilateral lower extremities with other complications: Secondary | ICD-10-CM

## 2020-01-31 NOTE — Telephone Encounter (Signed)

## 2020-02-01 ENCOUNTER — Other Ambulatory Visit: Payer: Self-pay

## 2020-02-01 ENCOUNTER — Ambulatory Visit (INDEPENDENT_AMBULATORY_CARE_PROVIDER_SITE_OTHER): Payer: 59 | Admitting: Vascular Surgery

## 2020-02-01 ENCOUNTER — Encounter: Payer: Self-pay | Admitting: Vascular Surgery

## 2020-02-01 ENCOUNTER — Ambulatory Visit (HOSPITAL_COMMUNITY)
Admission: RE | Admit: 2020-02-01 | Discharge: 2020-02-01 | Disposition: A | Discharge status: Home / Self Care | Payer: 59 | Source: Ambulatory Visit | Attending: Vascular Surgery | Admitting: Vascular Surgery

## 2020-02-01 VITALS — BP 99/68 | HR 70 | Temp 98.1°F | Resp 20 | Ht 64.0 in | Wt 191.3 lb

## 2020-02-01 DIAGNOSIS — I83893 Varicose veins of bilateral lower extremities with other complications: Secondary | ICD-10-CM | POA: Insufficient documentation

## 2020-02-01 DIAGNOSIS — I83813 Varicose veins of bilateral lower extremities with pain: Secondary | ICD-10-CM | POA: Diagnosis not present

## 2020-02-01 DIAGNOSIS — I872 Venous insufficiency (chronic) (peripheral): Secondary | ICD-10-CM

## 2020-02-01 NOTE — Progress Notes (Signed)
REASON FOR CONSULT:    Painful varicose veins bilaterally.  The consult is requested by Dr. Gerarda Fraction.  ASSESSMENT & PLAN:   PAINFUL VARICOSE VEINS BILATERALLY: This patient has significant varicose veins bilaterally.  She does have deep venous reflux bilaterally and superficial venous reflux on the right side.  We have discussed conservative measures to help with her symptoms.  Specifically, we discussed the importance of daily leg elevation and the proper positioning for this.  She currently wears a pantyhose style compression stocking and will go to try her in a knee-high 15-20 stockings see if this is more practical for her.  I have encouraged her to avoid prolonged sitting and standing.  We have discussed the importance of exercise specifically walking and water aerobics.  Currently I do not think she is a candidate for laser ablation of the right great saphenous vein which does have reflux but is not especially dilated.  In addition the vein is somewhat superficial in the thigh.  If her symptoms her varicose veins progressed and I will be happy to see her back at anytime in the future.  Deitra Mayo, MD Office: [phone]   HPI:   Jasmine Potter is a pleasant 54 y.o. female, who presents with painful varicose veins bilaterally.  She is had varicose veins in both legs for 9 or 10 years.  She describes significant aching pain heaviness and throbbing in both legs and also burning.  Her symptoms are aggravated by standing.  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  She does try to elevate her legs some which does help.  She wears pantyhose style compression stockings but does not think that these help significantly.  She is had no previous venous procedures.  She is had no previous history of DVTs.  Her mother had varicose veins.  Her only risk factors for peripheral vascular disease include hypertension and a remote history of tobacco use.  She quit 8 years  ago.  Past Medical History:  Diagnosis Date  . ADD (attention deficit disorder)   . Back pain   . Chronic back pain   . Depression   . Hypertension   . OA (osteoarthritis)   . Pneumonia   . Spondylosis     Family History  Problem Relation Age of Onset  . Breast cancer Mother   . Heart attack Father   . Leukemia Sister   . Colon cancer Brother   . Breast cancer Sister     SOCIAL HISTORY: Social History   Socioeconomic History  . Marital status: Single    Spouse name: Not on file  . Number of children: Not on file  . Years of education: Not on file  . Highest education level: Not on file  Occupational History  . Not on file  Tobacco Use  . Smoking status: Former Smoker    Types: Cigarettes  . Smokeless tobacco: Never Used  . Tobacco comment: quit smoking cigarettes in 2014  Substance and Sexual Activity  . Alcohol use: No  . Drug use: No  . Sexual activity: Not on file  Other Topics Concern  . Not on file  Social History Narrative   ** Merged History Encounter **       Social Determinants of Health   Financial Resource Strain:   . Difficulty of Paying Living Expenses:   Food Insecurity:   . Worried About Charity fundraiser in the  Last Year:   . [place] in the Last Year:   Transportation Needs:   . Film/video editor (Medical):   Marland Kitchen Lack of Transportation (Non-Medical):   Physical Activity:   . Days of Exercise per Week:   . Minutes of Exercise per Session:   Stress:   . Feeling of Stress :   Social Connections:   . Frequency of Communication with Friends and Family:   . Frequency of Social Gatherings with Friends and Family:   . Attends Religious Services:   . Active Member of Clubs or Organizations:   . Attends Archivist Meetings:   Marland Kitchen Marital Status:   Intimate Partner Violence:   . Fear of Current or Ex-Partner:   . Emotionally Abused:   Marland Kitchen Physically Abused:   . Sexually Abused:     No Known Allergies  Current  Outpatient Medications  Medication Sig Dispense Refill  . aspirin EC 81 MG tablet Take 81 mg by mouth daily.    Marland Kitchen atenolol (TENORMIN) 50 MG tablet Take 50 mg by mouth daily.    . chlorthalidone (HYGROTON) 25 MG tablet Take 25 mg by mouth daily.    . clonazePAM (KLONOPIN) 1 MG tablet Take 1 mg by mouth 3 (three) times daily as needed.    . fish oil-omega-3 fatty acids 1000 MG capsule Take 2 g by mouth 2 (two) times daily.    Marland Kitchen FLUoxetine (PROZAC) 20 MG capsule Take 20 mg by mouth daily.    Marland Kitchen OVER THE COUNTER MEDICATION Collagen PO Daily Hair skin Nail PO Daily Reishi mushroom PO Daily    . Oxycodone HCl 10 MG TABS Take 10 mg by mouth every 4 (four) hours as needed.  0  . zolpidem (AMBIEN) 10 MG tablet Take 10 mg by mouth at bedtime as needed for sleep.     No current facility-administered medications for this visit.    REVIEW OF SYSTEMS:  [X]  denotes positive finding, [ ]  denotes negative finding Cardiac  Comments:  Chest pain or chest pressure:    Shortness of breath upon exertion:    Short of breath when lying flat:    Irregular heart rhythm:        Vascular    Pain in calf, thigh, or hip brought on by ambulation: x   Pain in feet at night that wakes you up from your sleep:     Blood clot in your veins:    Leg swelling:  x       Pulmonary    Oxygen at home:    Productive cough:     Wheezing:         Neurologic    Sudden weakness in arms or legs:     Sudden numbness in arms or legs:     Sudden onset of difficulty speaking or slurred speech:    Temporary loss of vision in one eye:     Problems with dizziness:         Gastrointestinal    Blood in stool:     Vomited blood:         Genitourinary    Burning when urinating:     Blood in urine:        Psychiatric    Major depression:         Hematologic    Bleeding problems:    Problems with blood clotting too easily:        Skin    Rashes or  ulcers:        Constitutional    Fever or chills:     PHYSICAL EXAM:    Vitals:   02/01/20 1452  BP: 99/68  Pulse: 70  Resp: 20  Temp: 98.1 F (36.7 C)  SpO2: 97%  Weight: 191 lb 4.8 oz (86.8 kg)  Height: 5\' 4"  (1.626 m)    GENERAL: The patient is a well-nourished female, in no acute distress. The vital signs are documented above. CARDIAC: There is a regular rate and rhythm.  VASCULAR: I do not detect carotid bruits.  She has palpable posterior tibial pulses bilaterally. She has monophasic dorsalis pedis signals and biphasic posterior tibial signals with Doppler bilaterally. She has reticular veins in varicose veins in the medial right thigh and varicose veins in the posterior right calf as documented below. She also has varicose veins in the medial left leg as documented below.        PULMONARY: There is good air exchange bilaterally without wheezing or rales. ABDOMEN: Soft and non-tender with normal pitched bowel sounds.  MUSCULOSKELETAL: There are no major deformities or cyanosis. NEUROLOGIC: No focal weakness or paresthesias are detected. SKIN: There are no ulcers or rashes noted. PSYCHIATRIC: The patient has a normal affect.  DATA:    VENOUS DUPLEX: I have independently interpreted her venous duplex scan today.  On the right side there is no evidence of DVT or superficial venous thrombosis.  There is deep venous reflux involving the common femoral vein.  There is superficial venous reflux from the saphenofemoral junction of the proximal calf.  The diameters of the vein ranged from 0.36 cm in the distal thigh to 0.5 cm in the proximal thigh.  There is some reflux in the small saphenous vein on the right but the vein is not especially dilated.  On the left side, there is no evidence of DVT or superficial venous thrombosis.  There is deep venous reflux involving the common femoral vein.  There is superficial venous reflux at the saphenofemoral junction and at the great saphenous vein around the knee only.

## 2020-02-20 ENCOUNTER — Ambulatory Visit: Payer: Self-pay | Admitting: "Endocrinology

## 2020-03-13 IMAGING — US US THYROID
1 series · 13 of 25 positions shown · non-contrast
Comparison: 08/10/2018

CLINICAL DATA: 53-year-old female with multinodular thyroid

EXAM:
THYROID ULTRASOUND
TECHNIQUE: Ultrasound examination of the thyroid gland and adjacent soft
tissues was performed.

[Series 1: us thyroid · 13 of 96 slices shown]
[im 1/96]
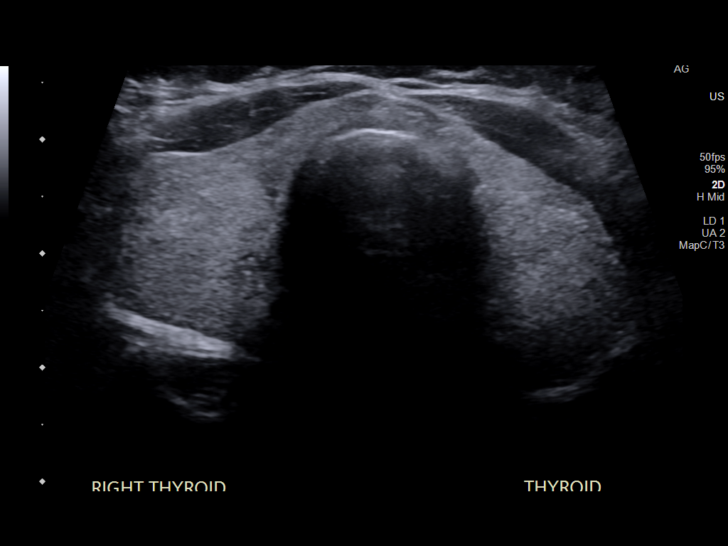
[im 8/96]
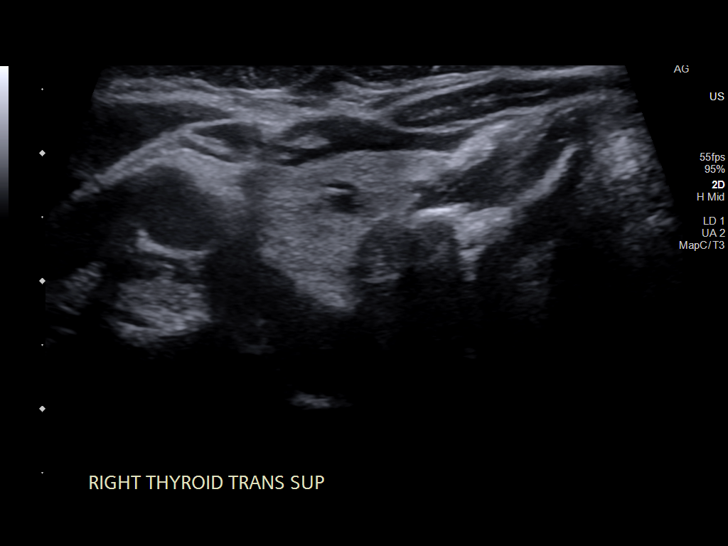
[im 16/96]
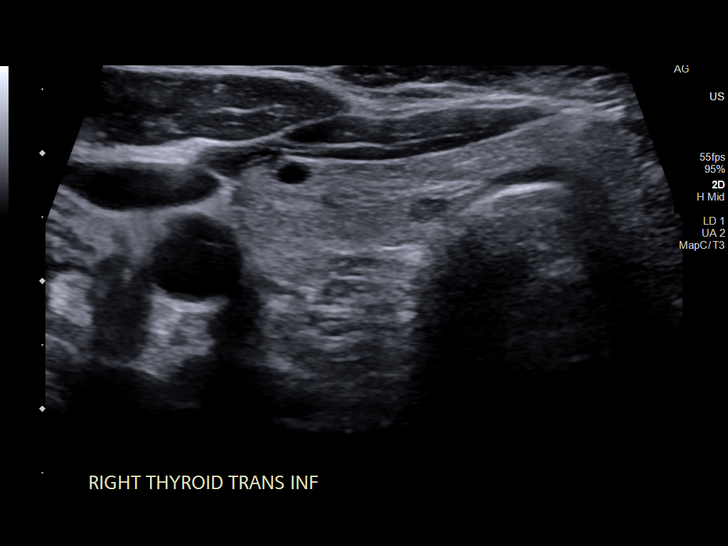
[im 24/96]
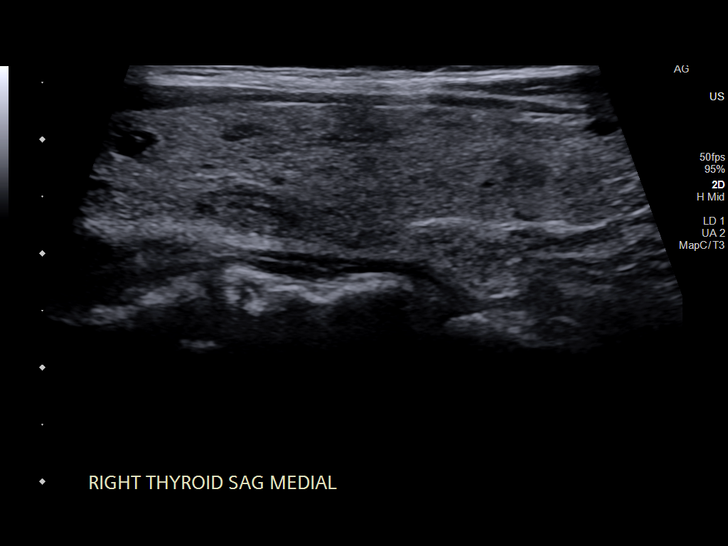
[im 32/96]
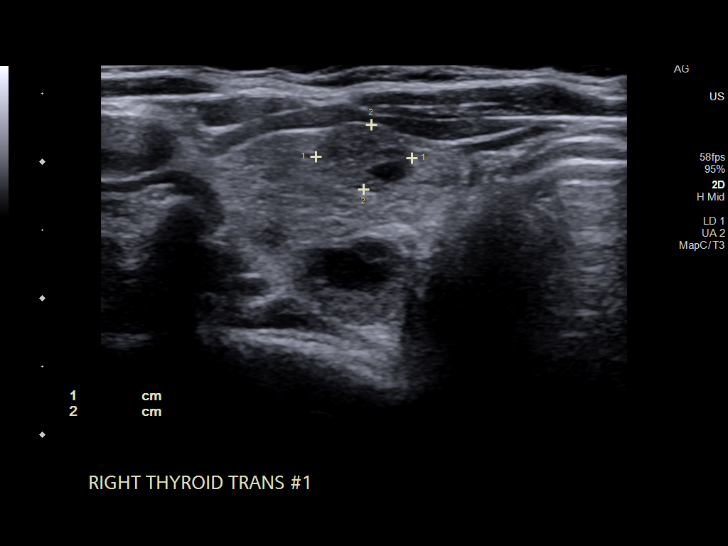
[im 40/96]
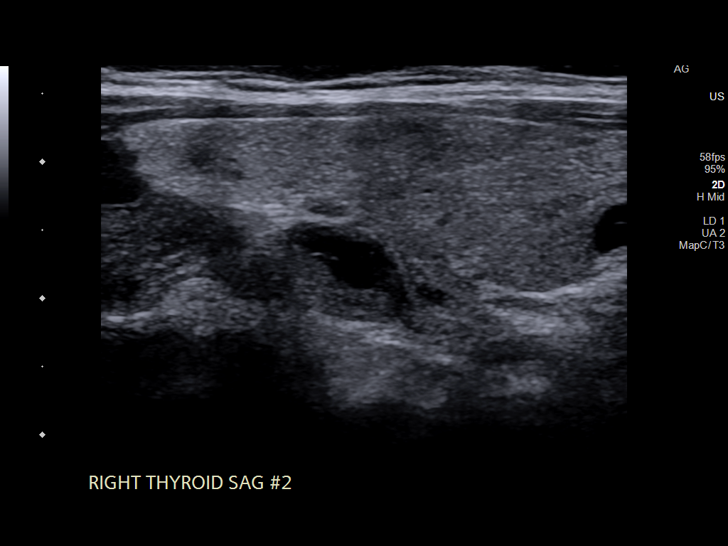
[im 48/96]
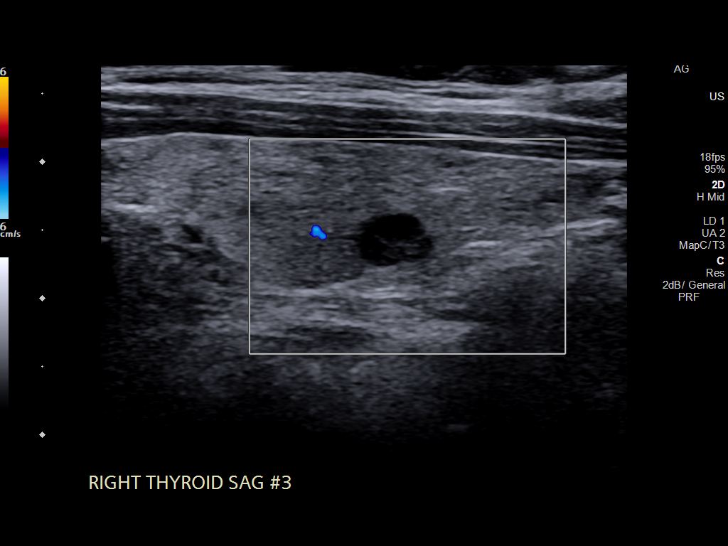
[im 56/96]
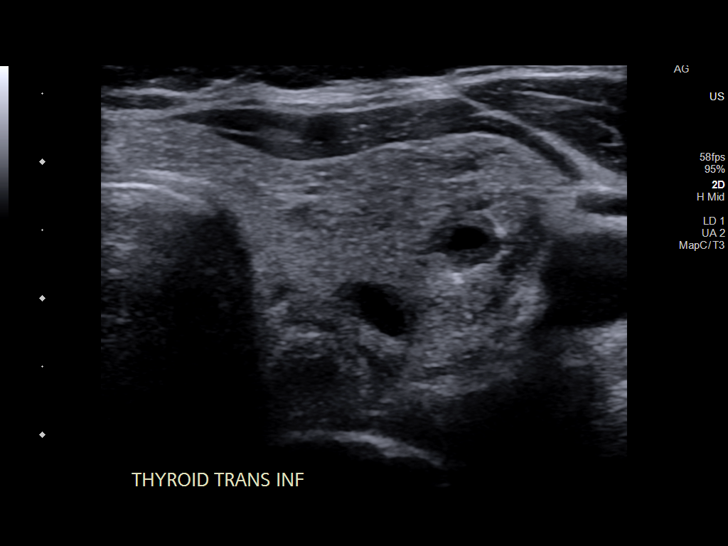
[im 64/96]
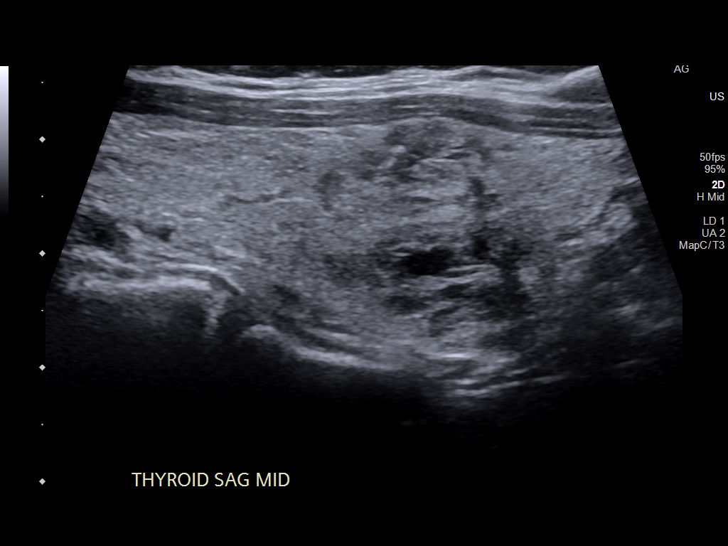
[im 72/96]
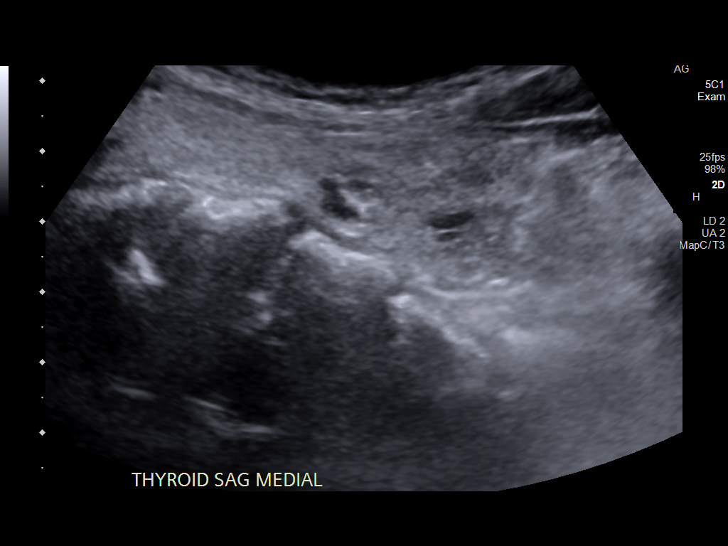
[im 80/96]
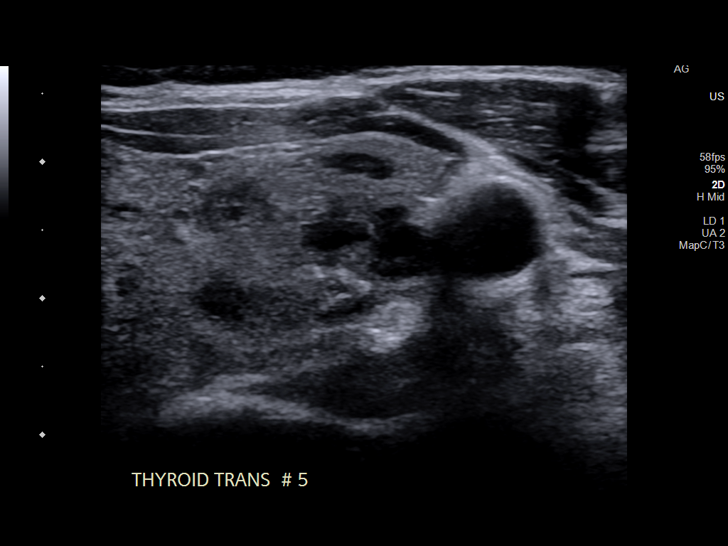
[im 88/96]
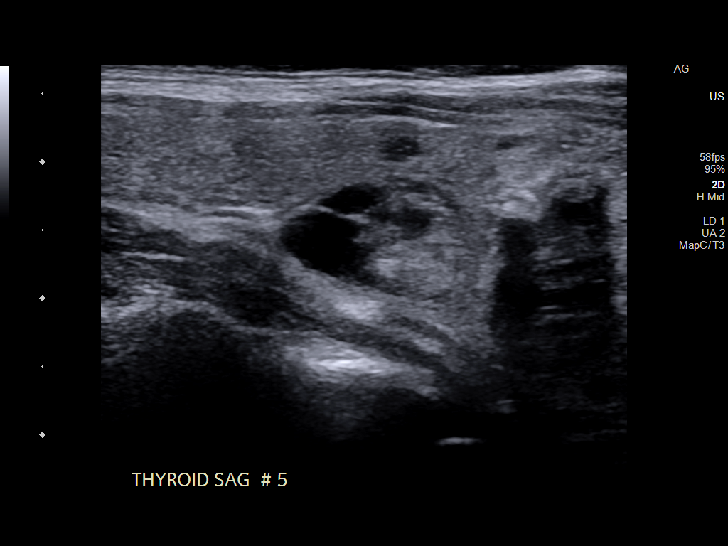
[im 96/96]
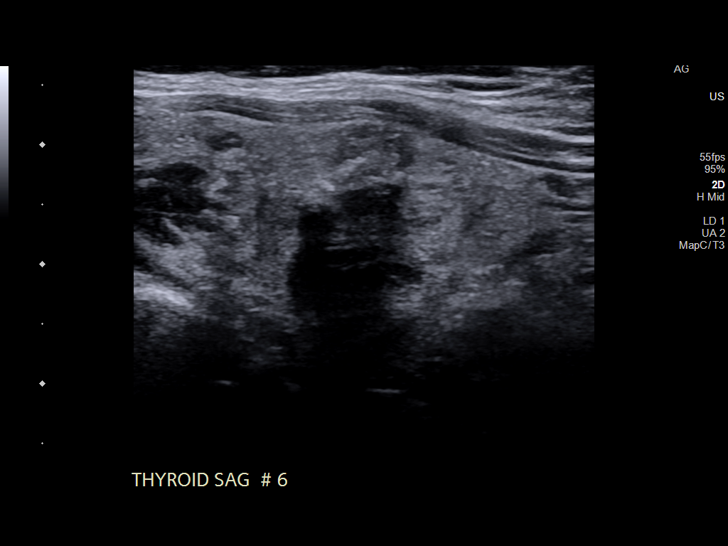

[13 of 25 positions shown; findings below may reference images not displayed]

FINDINGS: Parenchymal Echotexture: Moderately heterogenous

Isthmus: 0.3 cm

Right lobe: 5.6 cm x 1.5 cm x 2.1 cm

Left lobe: 5.7 cm x 2.0 cm x 2.3 cm

_________________________________________________________

Estimated total number of nodules >/= 1 cm: 3

Number of spongiform nodules >/=  2 cm not described below (TR1): 0

Number of mixed cystic and solid nodules >/= 1.5 cm not described
below (TR2): 0

_________________________________________________________

Nodule 1 superior right thyroid 7 mm and does not meet criteria for
surveillance or biopsy.

Nodule 2 right mid thyroid, 1.0 cm, with cystic and solid features,
TR 3 on the current ultrasound. This does not meet criteria for any
further surveillance.

Nodule 3 inferior right thyroid, 6 mm, smaller than previous and
does not meet criteria for surveillance or biopsy.

Nodule 4 superior left thyroid, 5 mm, smaller than previous and does
not meet criteria for surveillance or biopsy.

Nodule 5 in the mid left thyroid, smaller than previous and now
cm, spongiform characteristics and does not meet criteria for
surveillance or biopsy.

Nodule 6 inferior left thyroid was previously biopsied. Assuming
prior benign result, no further specific follow-up would be
indicated.

No adenopathy
IMPRESSION: Nodule labeled 2 mid right thyroid demonstrates cystic and solid
features on the current ultrasound, TR 3, and no longer meets
criteria for continued surveillance, as designated by the newly
established ACR TI-RADS criteria.

Recommendations follow those established by the new ACR TI-RADS
criteria ([HOSPITAL] 9195;[DATE]).

## 2020-03-15 ENCOUNTER — Ambulatory Visit: Payer: Self-pay | Admitting: "Endocrinology

## 2020-04-02 ENCOUNTER — Encounter: Payer: Self-pay | Admitting: "Endocrinology

## 2020-04-02 ENCOUNTER — Ambulatory Visit (INDEPENDENT_AMBULATORY_CARE_PROVIDER_SITE_OTHER): Payer: 59 | Admitting: "Endocrinology

## 2020-04-02 ENCOUNTER — Other Ambulatory Visit: Payer: Self-pay

## 2020-04-02 VITALS — BP 100/66 | HR 60 | Ht 64.0 in | Wt 184.8 lb

## 2020-04-02 DIAGNOSIS — E673 Hypervitaminosis D: Secondary | ICD-10-CM | POA: Diagnosis not present

## 2020-04-02 DIAGNOSIS — E042 Nontoxic multinodular goiter: Secondary | ICD-10-CM | POA: Diagnosis not present

## 2020-04-02 DIAGNOSIS — E0590 Thyrotoxicosis, unspecified without thyrotoxic crisis or storm: Secondary | ICD-10-CM

## 2020-04-02 LAB — POCT GLYCOSYLATED HEMOGLOBIN (HGB A1C): Hemoglobin A1C: 5.2 % (ref 4.0–5.6)

## 2020-04-02 NOTE — Progress Notes (Signed)
04/02/2020, 6:31 PM   Endocrinology follow-up note   Subjective:    Patient ID: Jasmine Potter, female    DOB: Mar 10, 1966, PCP Cory Munch, PA-C   Past Medical History:  Diagnosis Date  . ADD (attention deficit disorder)   . Back pain   . Chronic back pain   . Depression   . Hypertension   . OA (osteoarthritis)   . Pneumonia   . Spondylosis    Past Surgical History:  Procedure Laterality Date  . APPENDECTOMY    . CATARACT EXTRACTION W/ INTRAOCULAR LENS IMPLANT     left eye  . CHOLECYSTECTOMY    . COLONOSCOPY W/ BIOPSIES AND POLYPECTOMY    . HEMORRHOID SURGERY    . INTRAOCULAR LENS REMOVAL Left 07/16/2017   Procedure: REMOVAL OF INTRAOCULAR LENS;  Surgeon: Jalene Mullet, MD;  Location: [facility];  Service: Ophthalmology;  Laterality: Left;  . LASER PHOTO ABLATION Left 07/16/2017   Procedure: LASER PHOTO ABLATION;  Surgeon: Jalene Mullet, MD;  Location: [facility];  Service: Ophthalmology;  Laterality: Left;  . PARS PLANA VITRECTOMY Left 07/16/2017   Procedure: PARS PLANA VITRECTOMY WITH 25 GAUGE WITH PLACEMENT OF INTRAOCULAR LENS;  Surgeon: Jalene Mullet, MD;  Location: [facility];  Service: Ophthalmology;  Laterality: Left;  . TUBAL LIGATION     Social History   Socioeconomic History  . Marital status: Single    Spouse name: Not on file  . Number of children: Not on file  . Years of education: Not on file  . Highest education level: Not on file  Occupational History  . Not on file  Tobacco Use  . Smoking status: Former Smoker    Types: Cigarettes  . Smokeless tobacco: Never Used  . Tobacco comment: quit smoking cigarettes in 2014  Substance and Sexual Activity  . Alcohol use: No  . Drug use: No  . Sexual activity: Not on file  Other Topics Concern  . Not on file  Social History Narrative   ** Merged History Encounter **       Social Determinants of Health   Financial Resource Strain:   . Difficulty of  Paying Living Expenses:   Food Insecurity:   . Worried About Charity fundraiser in the Last Year:   . Arboriculturist in the Last Year:   Transportation Needs:   . Film/video editor (Medical):   Marland Kitchen Lack of Transportation (Non-Medical):   Physical Activity:   . Days of Exercise per Week:   . Minutes of Exercise per Session:   Stress:   . Feeling of Stress :   Social Connections:   . Frequency of Communication with Friends and Family:   . Frequency of Social Gatherings with Friends and Family:   . Attends Religious Services:   . Active Member of Clubs or Organizations:   . Attends Archivist Meetings:   Marland Kitchen Marital Status:    Outpatient Encounter Medications as of 04/02/2020  Medication Sig  . aspirin EC 81 MG tablet Take 81 mg by mouth daily.  Marland Kitchen atenolol (TENORMIN) 50 MG tablet Take 50 mg by mouth daily.  . chlorthalidone (HYGROTON) 25 MG tablet Take 25 mg by mouth  daily.  . clonazePAM (KLONOPIN) 1 MG tablet Take 1 mg by mouth 3 (three) times daily as needed.  . fish oil-omega-3 fatty acids 1000 MG capsule Take 2 g by mouth 2 (two) times daily.  Marland Kitchen FLUoxetine (PROZAC) 20 MG capsule Take 20 mg by mouth daily.  Marland Kitchen OVER THE COUNTER MEDICATION Collagen PO Daily Hair skin Nail PO Daily Reishi mushroom PO Daily  . Oxycodone HCl 10 MG TABS Take 10 mg by mouth every 4 (four) hours as needed.  . zolpidem (AMBIEN) 10 MG tablet Take 10 mg by mouth at bedtime as needed for sleep.   No facility-administered encounter medications on file as of 04/02/2020.   ALLERGIES: No Known Allergies  VACCINATION STATUS: Immunization History  Administered Date(s) Administered  . Influenza-Unspecified 09/10/2018    HPI Jasmine Potter is 54 y.o. female who is being seen in follow-up for history of abnormal thyroid function tests consistent with subclinical hypothyroid.  She also has history of hypovitaminosis D, goiter.   PCP:  Cory Munch, PA-C.   she is known to have multinodular  goiter at least since 2014.  Her most recent thyroid ultrasound on August 10, 2018 showed stable 2.2 cm nodule previously biopsied in the left lobe, along with another nodule measuring 1.9 cm.  A separate nodule on the right lobe measuring 1 cm.   -She was also found to have mildly suppressed TSH with did not require any intervention.  She continues to have normal thyroid hormone measurement.  She has no new complaints today.    -  She has no family history of thyroid malignancy nor dysfunction.  She denies dysphagia, shortness of breath, nor voice change.   She denies any prior exposure to neck radiation.   She is currently not on any antithyroid medications nor thyroid hormone supplements.  Review of Systems Limited as above. Objective:    BP 100/66   Pulse 60   Ht 5\' 4"  (1.626 m)   Wt 184 lb 12.8 oz (83.8 kg)   LMP 03/06/2013   BMI 31.72 kg/m   Wt Readings from Last 3 Encounters:  04/02/20 184 lb 12.8 oz (83.8 kg)  02/01/20 191 lb 4.8 oz (86.8 kg)  09/20/19 189 lb (85.7 kg)     CMP ( most recent) CMP     Component Value Date/Time   NA 137 10/06/2018 0946   K 4.3 10/06/2018 0946   CL 95 (L) 10/06/2018 0946   CO2 30 10/06/2018 0946   GLUCOSE 78 10/06/2018 0946   BUN 20 01/27/2020 0000   CREATININE 0.8 01/27/2020 0000   CREATININE 0.59 10/06/2018 0946   CALCIUM 9.8 01/27/2020 0000   PROT 7.1 10/06/2018 0946   ALBUMIN 3.9 09/25/2013 1422   AST 42 (H) 10/06/2018 0946   ALT 33 (H) 10/06/2018 0946   ALKPHOS 61 09/25/2013 1422   BILITOT 0.5 10/06/2018 0946   GFRNONAA 104 10/06/2018 0946   GFRAA 120 10/06/2018 0946   No recent thyroid function test to review.  August 10, 2018 thyroid ultrasound: Right lobe measuring 5.5 cm (previously 5.8 cm) with a 1 cm well-circumscribed nodule - no suspicious features.  Left lobe measures 5.6 cm (previously 5.9 ) with 2.2 cm previously biopsied nodule which revealed benign findings, and a new 1.9 cm partially cystic nodule, no additional  suspicious features.  Recent Results (from the past 2160 hour(s))  VITAMIN D 25 Hydroxy (Vit-D Deficiency, Fractures)     Status: None   Collection Time: 01/27/20 12:00  AM  Result Value Ref Range   Vit D, 25-Hydroxy [id number]   Basic metabolic panel     Status: None   Collection Time: 01/27/20 12:00 AM  Result Value Ref Range   BUN 20 4 - 21   Creatinine 0.8 0.5 - 1.1  Comprehensive metabolic panel     Status: None   Collection Time: 01/27/20 12:00 AM  Result Value Ref Range   Calcium 9.8 8.7 - 10.7    Comment: PTH 26  TSH     Status: Abnormal   Collection Time: 01/27/20 12:00 AM  Result Value Ref Range   TSH 0.33 (A) 0.41 - 5.90    Comment: ft4 1.41, t4 8.2  HgB A1c     Status: None   Collection Time: 04/02/20  2:15 PM  Result Value Ref Range   Hemoglobin A1C 5.2 4.0 - 5.6 %   HbA1c POC (<> result, manual entry)     HbA1c, POC (prediabetic range)     HbA1c, POC (controlled diabetic range)       Assessment & Plan:   1. Multinodular goiter -I reviewed her previsit studies with her.  Her recent thyroid ultrasound confirms stable findings with no concerning changes.  She would not have antithyroid intervention at this time.  Her thyroid function tests are also consistent with mild subclinical hypothyroidism, does not need antithyroid intervention at this time.  -She will be reevaluated in 6 months with repeat thyroid function tests and and thyroid ultrasound.    2. Class 1 obesity due to excess calories  -She has lost 16 pounds over 2 years.  Her point-of-care A1c is 5.2%.  - she  admits there is a room for improvement in her diet and drink choices. -  Suggestion is made for her to avoid simple carbohydrates  from her diet including Cakes, Sweet Desserts / Pastries, Ice Cream, Soda (diet and regular), Sweet Tea, Candies, Chips, Cookies, Sweet Pastries,  Store Bought Juices, Alcohol in Excess of  1-2 drinks a day, Artificial Sweeteners, Coffee Creamer, and "Sugar-free" Products.  This will help patient to have stable blood glucose profile and potentially avoid unintended weight gain.  -Her previsit labs show correction of her vitamin D to 50.1, from 102 which was supraphysiologic.  She is advised to stay off of vitamin D supplements until next measurement in 5-6 months.  Her previous mild hypercalcemia has resolved to 9.8, along with normal PTH of 26.  She is advised to maintain close follow-up with her PCP BenjaminMann, PA-C     - Time spent on this patient care encounter:  20 minutes of which 50% was spent in  counseling and the rest reviewing  her current and  previous labs / studies and medications  doses and developing a plan for long term care. Micael Hampshire Nack  participated in the discussions, expressed understanding, and voiced agreement with the above plans.  All questions were answered to her satisfaction. she is encouraged to contact clinic should she have any questions or concerns prior to her return visit.    Follow up plan: Return in about 6 months (around 10/03/2020) for F/U with Pre-visit Labs.   Glade Lloyd, MD [facility] [address] [place], [place] [address] Phone: [phone]  Fax: [phone]     04/02/2020, 6:31 PM  This note was partially dictated with voice recognition software. Similar sounding words can be transcribed inadequately or may not  be corrected upon review.

## 2020-07-04 ENCOUNTER — Other Ambulatory Visit (HOSPITAL_COMMUNITY): Payer: Self-pay | Admitting: Internal Medicine

## 2020-07-04 DIAGNOSIS — M546 Pain in thoracic spine: Secondary | ICD-10-CM

## 2020-07-04 DIAGNOSIS — Z1389 Encounter for screening for other disorder: Secondary | ICD-10-CM

## 2020-07-26 ENCOUNTER — Other Ambulatory Visit (HOSPITAL_COMMUNITY): Payer: Self-pay | Admitting: Internal Medicine

## 2020-07-26 DIAGNOSIS — Z1231 Encounter for screening mammogram for malignant neoplasm of breast: Secondary | ICD-10-CM

## 2020-08-06 ENCOUNTER — Ambulatory Visit (HOSPITAL_COMMUNITY): Payer: 59

## 2020-09-26 ENCOUNTER — Ambulatory Visit (HOSPITAL_COMMUNITY): Payer: 59

## 2020-09-27 ENCOUNTER — Other Ambulatory Visit (HOSPITAL_COMMUNITY): Payer: Self-pay | Admitting: Internal Medicine

## 2020-09-27 DIAGNOSIS — M549 Dorsalgia, unspecified: Secondary | ICD-10-CM

## 2020-10-03 ENCOUNTER — Ambulatory Visit (HOSPITAL_COMMUNITY): Admission: RE | Admit: 2020-10-03 | Payer: 59 | Source: Ambulatory Visit

## 2020-10-08 ENCOUNTER — Ambulatory Visit (HOSPITAL_COMMUNITY): Payer: 59

## 2020-10-08 ENCOUNTER — Ambulatory Visit: Payer: 59 | Admitting: "Endocrinology

## 2020-11-19 ENCOUNTER — Other Ambulatory Visit: Payer: Self-pay

## 2020-11-19 ENCOUNTER — Ambulatory Visit (HOSPITAL_COMMUNITY)
Admission: RE | Admit: 2020-11-19 | Discharge: 2020-11-19 | Disposition: A | Discharge status: Home / Self Care | Payer: 59 | Source: Ambulatory Visit | Attending: Internal Medicine | Admitting: Internal Medicine

## 2020-11-19 DIAGNOSIS — Z1231 Encounter for screening mammogram for malignant neoplasm of breast: Secondary | ICD-10-CM | POA: Insufficient documentation

## 2020-11-30 ENCOUNTER — Ambulatory Visit: Payer: 59 | Admitting: "Endocrinology

## 2021-06-12 ENCOUNTER — Other Ambulatory Visit: Payer: Self-pay

## 2021-06-12 DIAGNOSIS — E673 Hypervitaminosis D: Secondary | ICD-10-CM

## 2021-06-12 DIAGNOSIS — E0590 Thyrotoxicosis, unspecified without thyrotoxic crisis or storm: Secondary | ICD-10-CM

## 2021-08-06 LAB — BASIC METABOLIC PANEL
BUN: 12 (ref 4–21)
Creatinine: 0.7 (ref 0.5–1.1)

## 2021-08-06 LAB — LIPID PANEL
Cholesterol: 180 (ref 0–200)
HDL: 75 — AB (ref 35–70)
LDL Cholesterol: 96
Triglycerides: 47 (ref 40–160)

## 2021-08-06 LAB — TSH: TSH: 0.41 (ref 0.41–5.90)

## 2021-08-16 LAB — TSH: TSH: 0.589 u[IU]/mL (ref 0.450–4.500)

## 2021-08-16 LAB — T3, FREE: T3, Free: 3.9 pg/mL (ref 2.0–4.4)

## 2021-08-16 LAB — T4, FREE: Free T4: 1.4 ng/dL (ref 0.82–1.77)

## 2021-08-16 LAB — VITAMIN D 25 HYDROXY (VIT D DEFICIENCY, FRACTURES): Vit D, 25-Hydroxy: 63.7 ng/mL (ref 30.0–100.0)

## 2021-08-28 ENCOUNTER — Encounter: Payer: Self-pay | Admitting: "Endocrinology

## 2021-08-28 ENCOUNTER — Other Ambulatory Visit: Payer: Self-pay

## 2021-08-28 ENCOUNTER — Ambulatory Visit (INDEPENDENT_AMBULATORY_CARE_PROVIDER_SITE_OTHER): Payer: 59 | Admitting: "Endocrinology

## 2021-08-28 VITALS — BP 120/84 | HR 56 | Ht 61.0 in | Wt 192.2 lb

## 2021-08-28 DIAGNOSIS — E042 Nontoxic multinodular goiter: Secondary | ICD-10-CM

## 2021-08-28 DIAGNOSIS — E0590 Thyrotoxicosis, unspecified without thyrotoxic crisis or storm: Secondary | ICD-10-CM | POA: Diagnosis not present

## 2021-08-28 DIAGNOSIS — E673 Hypervitaminosis D: Secondary | ICD-10-CM

## 2021-08-28 NOTE — Progress Notes (Signed)
08/28/2021, 9:11 AM   Endocrinology follow-up note   Subjective:    Patient ID: Jasmine Potter, female    DOB: 29-Feb-1966, PCP Cory Munch, PA-C   Past Medical History:  Diagnosis Date   ADD (attention deficit disorder)    Back pain    Chronic back pain    Depression    Hypertension    OA (osteoarthritis)    Pneumonia    Spondylosis    Past Surgical History:  Procedure Laterality Date   APPENDECTOMY     CATARACT EXTRACTION W/ INTRAOCULAR LENS IMPLANT     left eye   CHOLECYSTECTOMY     COLONOSCOPY W/ BIOPSIES AND POLYPECTOMY     HEMORRHOID SURGERY     INTRAOCULAR LENS REMOVAL Left 07/16/2017   Procedure: REMOVAL OF INTRAOCULAR LENS;  Surgeon: Jalene Mullet, MD;  Location: [place];  Service: Ophthalmology;  Laterality: Left;   LASER PHOTO ABLATION Left 07/16/2017   Procedure: LASER PHOTO ABLATION;  Surgeon: Jalene Mullet, MD;  Location: [place];  Service: Ophthalmology;  Laterality: Left;   PARS PLANA VITRECTOMY Left 07/16/2017   Procedure: PARS PLANA VITRECTOMY WITH 25 GAUGE WITH PLACEMENT OF INTRAOCULAR LENS;  Surgeon: Jalene Mullet, MD;  Location: [place];  Service: Ophthalmology;  Laterality: Left;   TUBAL LIGATION     Social History   Socioeconomic History   Marital status: Single    Spouse name: Not on file   Number of children: Not on file   Years of education: Not on file   Highest education level: Not on file  Occupational History   Not on file  Tobacco Use   Smoking status: Former    Types: Cigarettes   Smokeless tobacco: Never   Tobacco comments:    quit smoking cigarettes in 2014  Vaping Use   Vaping Use: Every day   Substances: Nicotine  Substance and Sexual Activity   Alcohol use: No   Drug use: No   Sexual activity: Not on file  Other Topics Concern   Not on file  Social History Narrative   ** Merged History Encounter **       Social Determinants of Health   Financial Resource  Strain: Not on file  Food Insecurity: Not on file  Transportation Needs: Not on file  Physical Activity: Not on file  Stress: Not on file  Social Connections: Not on file   Outpatient Encounter Medications as of 08/28/2021  Medication Sig   aspirin EC 81 MG tablet Take 81 mg by mouth daily.   atenolol (TENORMIN) 50 MG tablet Take 50 mg by mouth daily.   chlorthalidone (HYGROTON) 25 MG tablet Take 25 mg by mouth daily.   clonazePAM (KLONOPIN) 1 MG tablet Take 1 mg by mouth 3 (three) times daily as needed.   fish oil-omega-3 fatty acids 1000 MG capsule Take 2 g by mouth 2 (two) times daily.   FLUoxetine (PROZAC) 20 MG capsule Take 20 mg by mouth daily.   OVER THE COUNTER MEDICATION Collagen PO Daily Hair skin Nail PO Daily Reishi mushroom PO Daily   Oxycodone HCl 10 MG TABS Take 10 mg by mouth every 4 (four) hours as needed.   zolpidem (AMBIEN) 10 MG  tablet Take 10 mg by mouth at bedtime as needed for sleep. (Patient not taking: Reported on 08/28/2021)   No facility-administered encounter medications on file as of 08/28/2021.   ALLERGIES: No Known Allergies  VACCINATION STATUS: Immunization History  Administered Date(s) Administered   Influenza-Unspecified 09/10/2018    HPI Jasmine Potter is 55 y.o. female who is being seen in follow-up for history of abnormal thyroid function tests consistent with subclinical hyperthyroidism, multinodular goiter.  She also has history of hypovitaminosis D.  PCP:  Cory Munch, PA-C.   she is known to have multinodular goiter at least since 2014.  Her most recent thyroid ultrasound on August 10, 2018 showed stable 2.2 cm nodule previously biopsied in the left lobe, along with another nodule measuring 1.9 cm.  A separate nodule on the right lobe measuring 1 cm.   -She was also found to have mildly suppressed TSH with did not require any intervention.  She continues to have normal thyroid function tests including previsit this time.  She has no  new complaints.   -  She has no family history of thyroid malignancy nor dysfunction.  She denies dysphagia, shortness of breath, nor voice change.   She denies any prior exposure to neck radiation.   She is currently not on any antithyroid medications nor thyroid hormone supplements.  Review of Systems Limited as above. Objective:    BP 120/84   Pulse (!) 56   Ht 5\' 1"  (1.549 m)   Wt 192 lb 3.2 oz (87.2 kg)   LMP 03/06/2013   BMI 36.32 kg/m   Wt Readings from Last 3 Encounters:  08/28/21 192 lb 3.2 oz (87.2 kg)  04/02/20 184 lb 12.8 oz (83.8 kg)  02/01/20 191 lb 4.8 oz (86.8 kg)     CMP ( most recent) CMP     Component Value Date/Time   NA 137 10/06/2018 0946   K 4.3 10/06/2018 0946   CL 95 (L) 10/06/2018 0946   CO2 30 10/06/2018 0946   GLUCOSE 78 10/06/2018 0946   BUN 12 08/06/2021 0000   CREATININE 0.7 08/06/2021 0000   CREATININE 0.59 10/06/2018 0946   CALCIUM 9.8 01/27/2020 0000   PROT 7.1 10/06/2018 0946   ALBUMIN 3.9 09/25/2013 1422   AST 42 (H) 10/06/2018 0946   ALT 33 (H) 10/06/2018 0946   ALKPHOS 61 09/25/2013 1422   BILITOT 0.5 10/06/2018 0946   GFRNONAA 104 10/06/2018 0946   GFRAA 120 10/06/2018 0946   No recent thyroid function test to review.  August 10, 2018 thyroid ultrasound: Right lobe measuring 5.5 cm (previously 5.8 cm) with a 1 cm well-circumscribed nodule - no suspicious features.  Left lobe measures 5.6 cm (previously 5.9 ) with 2.2 cm previously biopsied nodule which revealed benign findings, and a new 1.9 cm partially cystic nodule, no additional suspicious features.  Recent Results (from the past 2160 hour(s))  Basic metabolic panel     Status: None   Collection Time: 08/06/21 12:00 AM  Result Value Ref Range   BUN 12 4 - 21   Creatinine 0.7 0.5 - 1.1  Lipid panel     Status: Abnormal   Collection Time: 08/06/21 12:00 AM  Result Value Ref Range   Triglycerides 47 40 - 160   Cholesterol 180 0 - 200   HDL 75 (A) 35 - 70   LDL  Cholesterol 96   TSH     Status: None   Collection Time: 08/06/21 12:00 AM  Result Value Ref Range   TSH 0.41 0.41 - 5.90  Vitamin D, 25-hydroxy     Status: None   Collection Time: 08/15/21  3:02 PM  Result Value Ref Range   Vit D, 25-Hydroxy 63.7 30.0 - 100.0 ng/mL    Comment: Vitamin D deficiency has been defined by the [place] practice guideline as a level of serum 25-OH vitamin D less than 20 ng/mL (1,2). The Endocrine Society went on to further define vitamin D insufficiency as a level between 21 and 29 ng/mL (2). 1. IOM (Institute of Medicine). 2010. Dietary reference    intakes for calcium and D. [place]: The    [employer]. 2. Holick MF, Binkley Tabor, Bischoff-Ferrari HA, et al.    Evaluation, treatment, and prevention of vitamin D    deficiency: an Endocrine Society clinical practice    guideline. JCEM. 2011 Jul; 96(7):1911-30.   T3, Free     Status: None   Collection Time: 08/15/21  3:02 PM  Result Value Ref Range   T3, Free 3.9 2.0 - 4.4 pg/mL  T4, Free     Status: None   Collection Time: 08/15/21  3:02 PM  Result Value Ref Range   Free T4 1.40 0.82 - 1.77 ng/dL  TSH     Status: None   Collection Time: 08/15/21  3:02 PM  Result Value Ref Range   TSH 0.589 0.450 - 4.500 uIU/mL     Assessment & Plan:   1. Multinodular goiter -I reviewed her previsit studies with her.  Her thyroid ultrasound from 2021  confirms stable findings with no concerning changes.  She would not have antithyroid intervention at this time.  Her thyroid function tests are also consistent with mild subclinical hyperthyroidism.  She did not need antithyroid intervention.  However, she will return in a year with repeat thyroid function test as well as thyroid/neck ultrasound.   -She will be reevaluated in 6 months with repeat thyroid function tests and and thyroid ultrasound.    2. Class 1 obesity due to excess calories  She has  fluctuating weight.  Gained 10 pounds since last visit. - she acknowledges that there is a room for improvement in her food and drink choices. - Suggestion is made for her to avoid simple carbohydrates  from her diet including Cakes, Sweet Desserts, Ice Cream, Soda (diet and regular), Sweet Tea, Candies, Chips, Cookies, Store Bought Juices, Alcohol in Excess of  1-2 drinks a day, Artificial Sweeteners,  Coffee Creamer, and "Sugar-free" Products, Lemonade. This will help patient to have more stable blood glucose profile and potentially avoid unintended weight gain.   -Her previsit labs show correction of her vitamin D at 63, improving from 102.  She is advised to stay off of vitamin D supplements until next measurement.    She is advised to maintain close follow-up with her PCP BenjaminMann, PA-C  I spent 22 minutes in the care of the patient today including review of labs from Thyroid Function, CMP, and other relevant labs ; imaging/biopsy records (current and previous including abstractions from other facilities); face-to-face time discussing  her lab results and symptoms, medications doses, her options of short and long term treatment based on the latest standards of care / guidelines;   and documenting the encounter.  Micael Hampshire Megna  participated in the discussions, expressed understanding, and voiced agreement with the above plans.  All questions were answered to her satisfaction. she is encouraged to  contact clinic should she have any questions or concerns prior to her return visit.    Follow up plan: Return in about 1 year (around 08/28/2022) for F/U with Pre-visit Labs, Thyroid / Neck Ultrasound.   Glade Lloyd, MD [facility] [address] [place], [place] [address] Phone: [phone]  Fax: [phone]     08/28/2021, 9:11 AM  This note was partially dictated with voice recognition software. Similar sounding words can be  transcribed inadequately or may not  be corrected upon review.

## 2021-09-12 ENCOUNTER — Encounter: Payer: Self-pay | Admitting: Adult Health

## 2021-09-12 ENCOUNTER — Ambulatory Visit (INDEPENDENT_AMBULATORY_CARE_PROVIDER_SITE_OTHER): Payer: 59 | Admitting: Adult Health

## 2021-09-12 ENCOUNTER — Other Ambulatory Visit (HOSPITAL_COMMUNITY)
Admission: RE | Admit: 2021-09-12 | Discharge: 2021-09-12 | Disposition: A | Discharge status: Home / Self Care | Payer: 59 | Source: Ambulatory Visit | Attending: Adult Health | Admitting: Adult Health

## 2021-09-12 ENCOUNTER — Other Ambulatory Visit: Payer: Self-pay

## 2021-09-12 VITALS — BP 107/72 | HR 74 | Ht 61.0 in | Wt 195.4 lb

## 2021-09-12 DIAGNOSIS — Z124 Encounter for screening for malignant neoplasm of cervix: Secondary | ICD-10-CM | POA: Insufficient documentation

## 2021-09-12 DIAGNOSIS — N950 Postmenopausal bleeding: Secondary | ICD-10-CM | POA: Diagnosis not present

## 2021-09-12 DIAGNOSIS — N816 Rectocele: Secondary | ICD-10-CM | POA: Diagnosis not present

## 2021-09-12 DIAGNOSIS — Z1211 Encounter for screening for malignant neoplasm of colon: Secondary | ICD-10-CM

## 2021-09-12 LAB — HEMOCCULT GUIAC POC 1CARD (OFFICE): Fecal Occult Blood, POC: NEGATIVE

## 2021-09-12 NOTE — Progress Notes (Signed)
  Subjective:     Patient ID: Jasmine Potter, female   DOB: 1966/10/30, 55 y.o.   MRN: [id number]  HPI Jasmine Potter is a 55 year old white female, divorced, PM in for pap and pelvic, referred by Rowan Blase PA. She had physical in September. She has had blood in her urine and seeing urology soon, and about a month ago, wiped blood. She works as PCA. PCP is [place].  Review of Systems Patient denies any headaches, hearing loss, fatigue, blurred vision, shortness of breath, chest pain, abdominal pain, problems with bowel movements, urination, or intercourse(no sex in over 10 years). No joint pain or mood swings.  Wiped blood about a month ago.   Reviewed past medical,surgical, social and family history. Reviewed medications and allergies.  Objective:   Physical Exam BP 107/72 (BP Location: Right Arm, Patient Position: Sitting, Cuff Size: Normal)   Pulse 74   Ht 5\' 1"  (1.549 m)   Wt 195 lb 6.4 oz (88.6 kg)   LMP 03/06/2013   BMI 36.92 kg/m   Skin warm and dry.Pelvic: external genitalia is normal in appearance no lesions, vagina: pale with loss of rugae,urethra has no lesions or masses noted, cervix:smooth,pap aith HR HPV genotyping performed, uterus: normal size, shape and contour, non tender, no masses felt, adnexa: no masses or tenderness noted. Bladder is non tender and no masses felt. On rectal exam no masses, negative hemoccult and +rectocele   AA is 0 Fall risk is low Depression screen PHQ 2/9 09/12/2021  Decreased Interest 0  Down, Depressed, Hopeless 0  PHQ - 2 Score 0  Altered sleeping 0  Tired, decreased energy 0  Change in appetite 2  Feeling bad or failure about yourself  3  Trouble concentrating 1  Moving slowly or fidgety/restless 0  Suicidal thoughts 0  PHQ-9 Score 6   On prozac GAD 7 : Generalized Anxiety Score 09/12/2021  Nervous, Anxious, on Edge 0  Control/stop worrying 0  Worry too much - different things 2  Trouble relaxing 1  Restless 0  Easily annoyed or irritable 0   Afraid - awful might happen 0  Total GAD 7 Score 3    Upstream - 09/12/21 1549       Pregnancy Intention Screening   Does the patient want to become pregnant in the next year? N/A    Does the patient's partner want to become pregnant in the next year? N/A    Would the patient like to discuss contraceptive options today? N/A      Contraception Wrap Up   Current Method Female Sterilization    End Method Female Sterilization    Contraception Counseling Provided No            Examination chaperoned by [employer].    Assessment:       1. Routine cervical smear Pap sent Pap in 3 years if normal Physical with PCP  Mammogram yearly Labs with PCP Colonoscopy per GI  2. Encounter for screening fecal occult blood testing  3. PMB (postmenopausal bleeding) Will get pelvic US at [facility] 09/19/21 at 3:30 pm   4. Rectocele     Plan:     Will talk when [place] results back

## 2021-09-17 ENCOUNTER — Other Ambulatory Visit: Payer: Self-pay | Admitting: Adult Health

## 2021-09-17 LAB — CYTOLOGY - PAP
Adequacy: ABSENT
Comment: NEGATIVE
Diagnosis: NEGATIVE
High risk HPV: NEGATIVE

## 2021-09-17 MED ORDER — METRONIDAZOLE 500 MG PO TABS: 500.0000 mg | ORAL_TABLET | Freq: Two times a day (BID) | ORAL | 0 refills | Status: DC

## 2021-09-19 ENCOUNTER — Ambulatory Visit (HOSPITAL_COMMUNITY): Admission: RE | Admit: 2021-09-19 | Payer: 59 | Source: Ambulatory Visit

## 2021-09-24 ENCOUNTER — Other Ambulatory Visit: Payer: Self-pay

## 2021-09-24 ENCOUNTER — Ambulatory Visit (HOSPITAL_COMMUNITY)
Admission: RE | Admit: 2021-09-24 | Discharge: 2021-09-24 | Disposition: A | Discharge status: Home / Self Care | Payer: 59 | Source: Ambulatory Visit | Attending: Adult Health | Admitting: Adult Health

## 2021-09-24 DIAGNOSIS — N950 Postmenopausal bleeding: Secondary | ICD-10-CM | POA: Diagnosis present

## 2021-12-06 ENCOUNTER — Ambulatory Visit: Payer: Self-pay | Admitting: Adult Health

## 2021-12-09 ENCOUNTER — Other Ambulatory Visit (HOSPITAL_COMMUNITY): Payer: Self-pay | Admitting: Internal Medicine

## 2021-12-09 DIAGNOSIS — Z1231 Encounter for screening mammogram for malignant neoplasm of breast: Secondary | ICD-10-CM

## 2021-12-12 ENCOUNTER — Ambulatory Visit: Payer: 59 | Admitting: Adult Health

## 2021-12-13 ENCOUNTER — Ambulatory Visit (HOSPITAL_COMMUNITY)
Admission: RE | Admit: 2021-12-13 | Discharge: 2021-12-13 | Disposition: A | Discharge status: Home / Self Care | Payer: 59 | Source: Ambulatory Visit | Attending: Internal Medicine | Admitting: Internal Medicine

## 2021-12-13 ENCOUNTER — Other Ambulatory Visit: Payer: Self-pay

## 2021-12-13 DIAGNOSIS — Z1231 Encounter for screening mammogram for malignant neoplasm of breast: Secondary | ICD-10-CM | POA: Insufficient documentation

## 2021-12-25 ENCOUNTER — Ambulatory Visit: Payer: 59 | Admitting: Adult Health

## 2021-12-30 ENCOUNTER — Encounter: Payer: Self-pay | Admitting: Obstetrics & Gynecology

## 2021-12-30 ENCOUNTER — Other Ambulatory Visit: Payer: Self-pay

## 2021-12-30 ENCOUNTER — Ambulatory Visit: Payer: 59 | Admitting: Obstetrics & Gynecology

## 2021-12-30 VITALS — BP 123/87 | HR 72 | Ht 61.0 in | Wt 199.0 lb

## 2021-12-30 DIAGNOSIS — D229 Melanocytic nevi, unspecified: Secondary | ICD-10-CM | POA: Diagnosis not present

## 2021-12-30 DIAGNOSIS — S20112A Abrasion of breast, left breast, initial encounter: Secondary | ICD-10-CM

## 2021-12-30 DIAGNOSIS — S20111A Abrasion of breast, right breast, initial encounter: Secondary | ICD-10-CM | POA: Diagnosis not present

## 2021-12-30 MED ORDER — BACITRACIN 500 UNIT/GM EX OINT: 1.0000 "application " | TOPICAL_OINTMENT | Freq: Two times a day (BID) | CUTANEOUS | 0 refills | Status: DC

## 2021-12-30 NOTE — Progress Notes (Signed)
° °  GYN VISIT Patient name: Jasmine Potter MRN [id number]  Date of birth: Jun 16, 1966 Chief Complaint:   Breast Problem (Left breast)  History of Present Illness:   Jasmine Potter is a 56 y.o. G69P1021 PM female being seen today for the following concern:  Breast issue: Notes a mole on her left breast that she previously didn't see before.  As far as she know it has not changed in size or shape.  Breast irritation: About 3 wks ago she wore a girdle that she didn't realize was causing significant rubbing/irritation.  She notes extreme pain/discomfort under both breast.  She has tried OTC remedies with only mild improvement.  Patient's last menstrual period was 03/06/2013.  Depression screen [facility] 2/9 09/12/2021  Decreased Interest 0  Down, Depressed, Hopeless 0  PHQ - 2 Score 0  Altered sleeping 0  Tired, decreased energy 0  Change in appetite 2  Feeling bad or failure about yourself  3  Trouble concentrating 1  Moving slowly or fidgety/restless 0  Suicidal thoughts 0  PHQ-9 Score 6     Review of Systems:   Pertinent items are noted in HPI Denies fever/chills, dizziness, headaches, visual disturbances, fatigue, shortness of breath, chest pain, abdominal pain, vomiting, bowel movements, urination, or intercourse unless otherwise stated above.  Pertinent History Reviewed:  Reviewed past medical,surgical, social, obstetrical and family history.  Reviewed problem list, medications and allergies. Physical Assessment:   Vitals:   12/30/21 1339  BP: 123/87  Pulse: 72  Weight: 199 lb (90.3 kg)  Height: 5\' 1"  (1.549 m)  Body mass index is 37.6 kg/m.       Physical Examination:   General appearance: alert, well appearing, and in no distress  Psych: mood appropriate, normal affect  Skin: warm & dry   Breast: on left breast- 26mm round dark brown symmetrical flat normal appearing mole, under both breast ~ 8cm bright pink linear abrasion- no activie bleeding, no induration or  erythema  Cardiovascular: normal heart rate noted  Respiratory: normal respiratory effort, no distress  Pelvic: examination not indicated  Extremities: no edema   Chaperone: N/A    Assessment & Plan:  1) Breast abrasion -reassured pt- no evidence of infection -based on location continues to have irritation/rubbing from bra.  Advised no undergarment for at least 1-2 wks -Bacitracin sent in or may use OTC Neosporin- if no improvement RTC  2) Benign mole- reassured pt of benign finding  No follow-ups on file.   Janyth Pupa, DO Attending [place], [facility] for [employer], [place]

## 2022-02-07 IMAGING — MG MM DIGITAL SCREENING BILAT W/ TOMO AND CAD
8 of 14 series · 8 of 40 positions shown · non-contrast
Comparison: Previous exam(s).

CLINICAL DATA: Screening.

EXAM:
DIGITAL SCREENING BILATERAL MAMMOGRAM WITH TOMOSYNTHESIS AND CAD
TECHNIQUE: Bilateral screening digital craniocaudal and mediolateral oblique
mammograms were obtained. Bilateral screening digital breast
tomosynthesis was performed. The images were evaluated with
computer-aided detection.

[R CC synth-2D (1 of 2)]
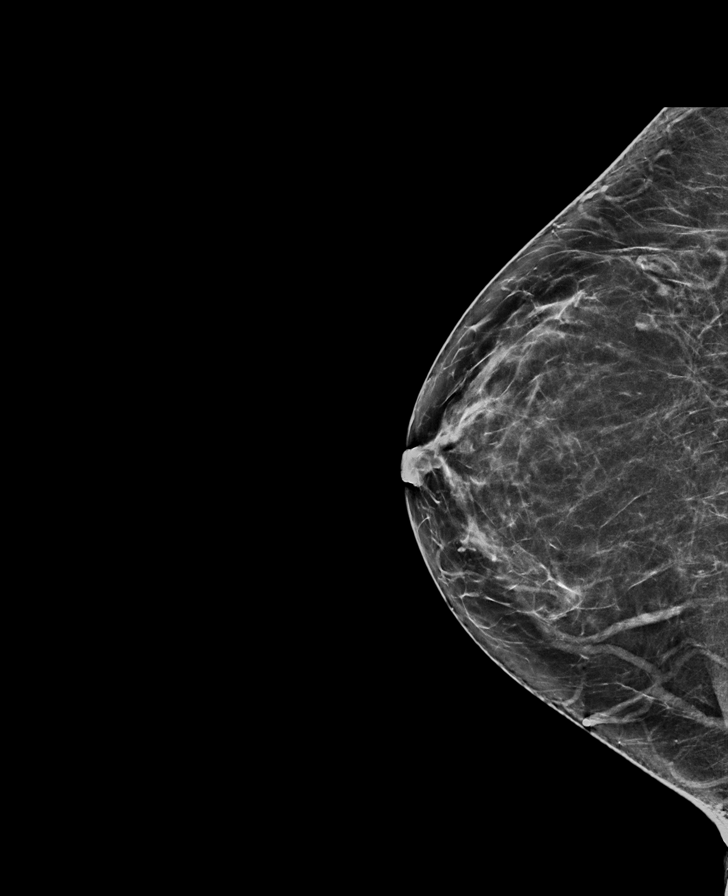

[R MLO synth-2D]
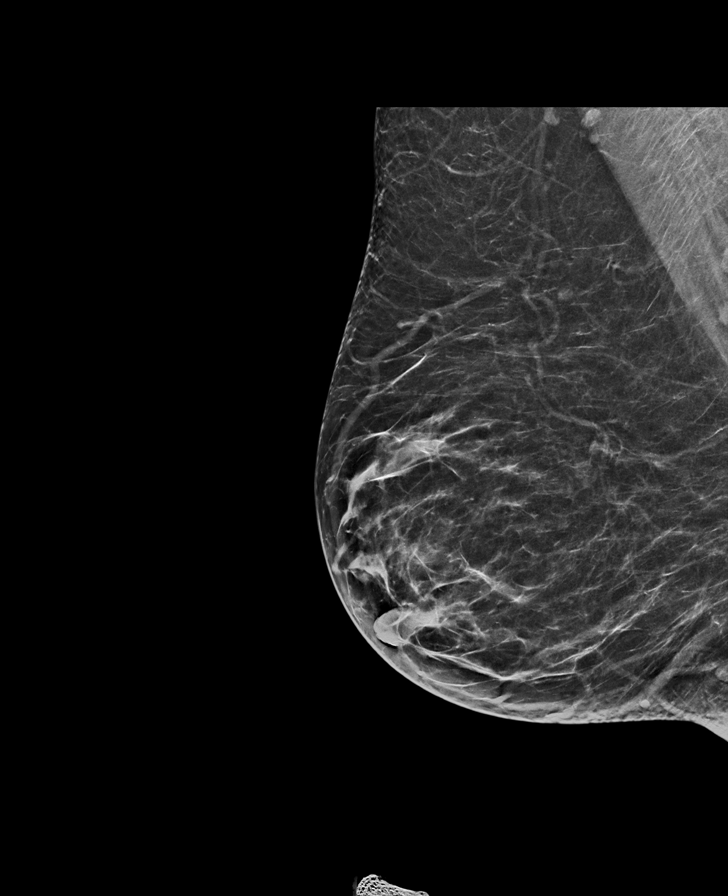

[R CC synth-2D (2 of 2)]
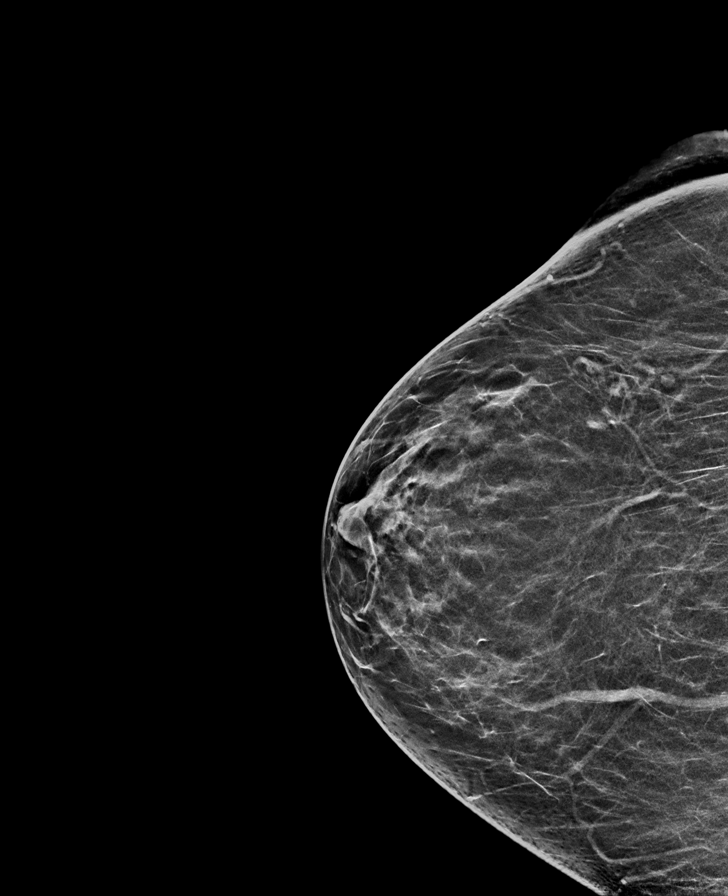

[L CC synth-2D]
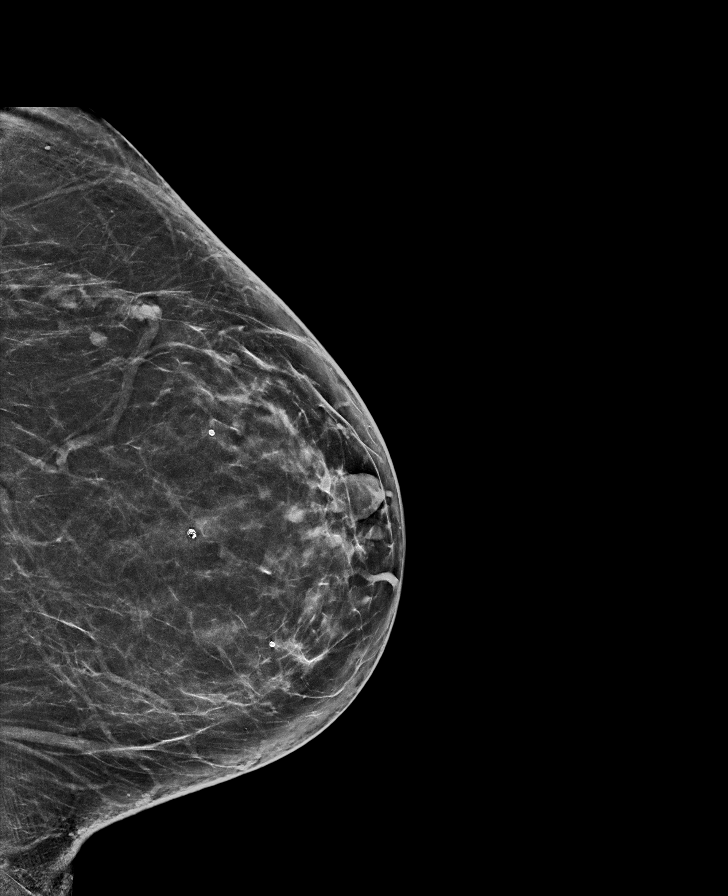

[L MLO synth-2D (1 of 3)]
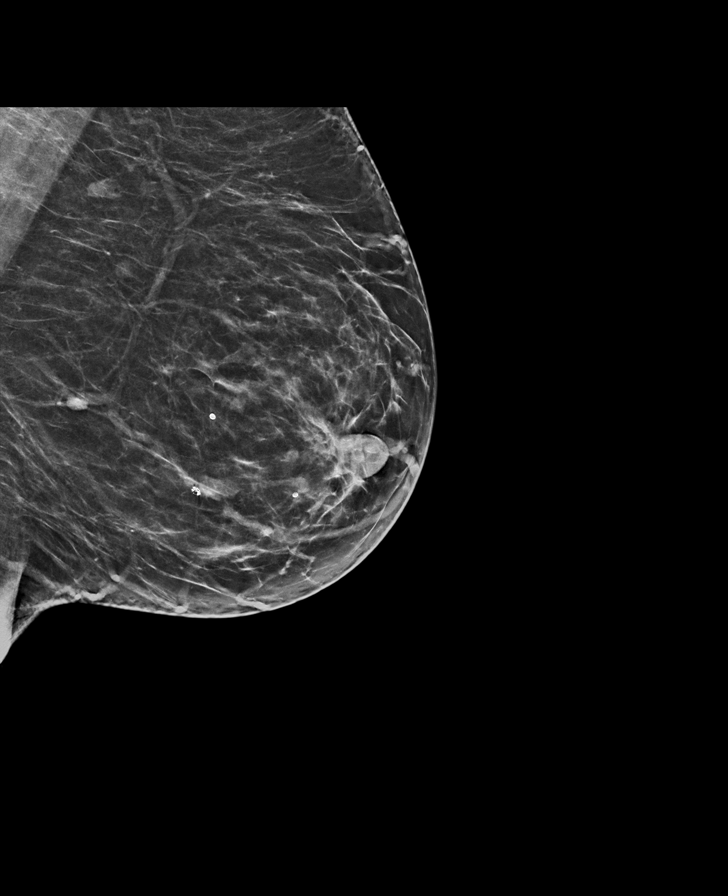

[L MLO synth-2D (2 of 3)]
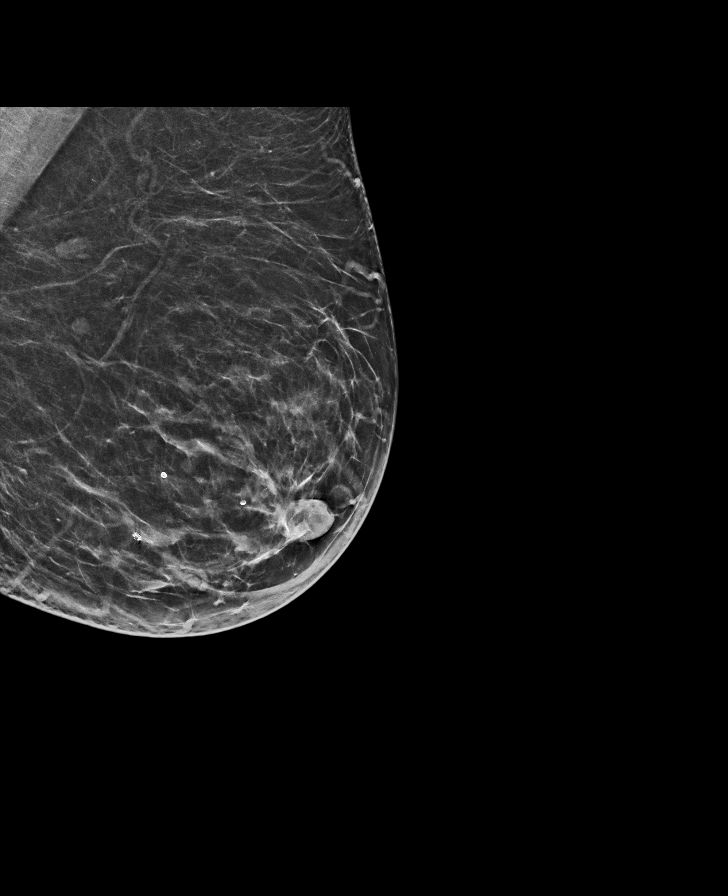

[L MLO synth-2D (3 of 3)]
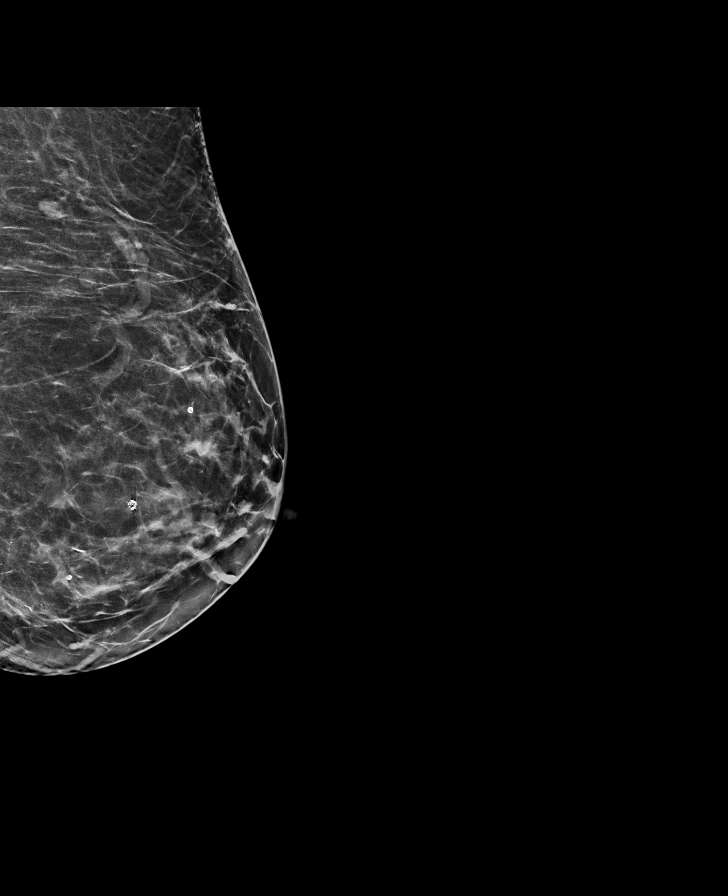

[L MLO tomo · tomo slice 36/71.0]
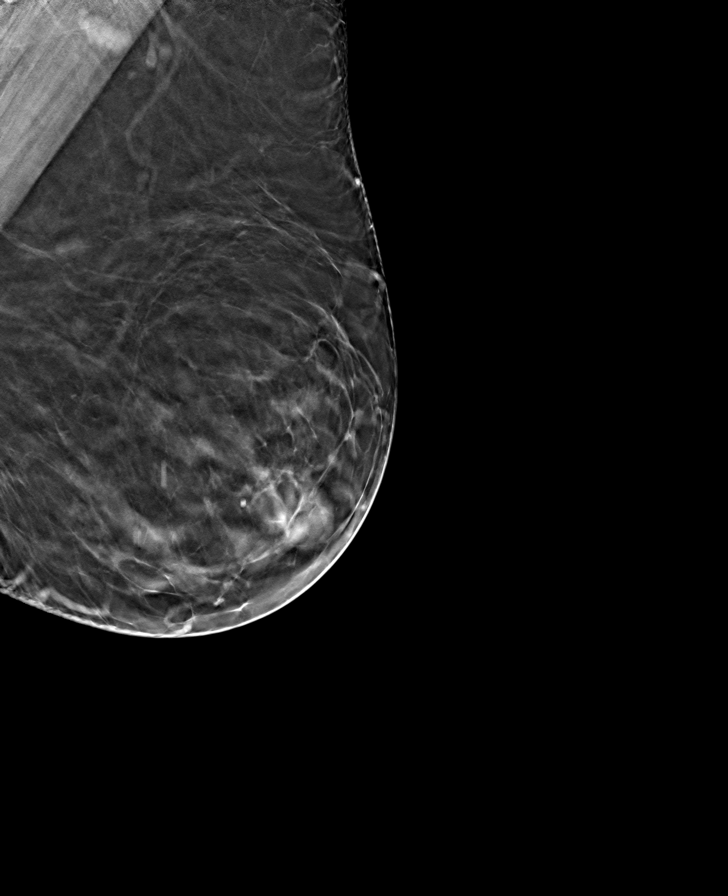

[8 of 40 positions shown; findings below may reference images not displayed]

ACR Breast Density Category b: There are scattered areas of
fibroglandular density.
FINDINGS: There are no findings suspicious for malignancy.
IMPRESSION: No mammographic evidence of malignancy. A result letter of this
screening mammogram will be mailed directly to the patient.

RECOMMENDATION:
Screening mammogram in one year. (Code:51-O-LD2)

BI-RADS CATEGORY  1: Negative.

## 2022-02-27 ENCOUNTER — Encounter (INDEPENDENT_AMBULATORY_CARE_PROVIDER_SITE_OTHER): Payer: Self-pay | Admitting: *Deleted

## 2022-03-27 ENCOUNTER — Telehealth (INDEPENDENT_AMBULATORY_CARE_PROVIDER_SITE_OTHER): Payer: Self-pay

## 2022-03-27 ENCOUNTER — Other Ambulatory Visit (INDEPENDENT_AMBULATORY_CARE_PROVIDER_SITE_OTHER): Payer: Self-pay

## 2022-03-27 ENCOUNTER — Encounter (INDEPENDENT_AMBULATORY_CARE_PROVIDER_SITE_OTHER): Payer: Self-pay | Admitting: *Deleted

## 2022-03-27 ENCOUNTER — Encounter (INDEPENDENT_AMBULATORY_CARE_PROVIDER_SITE_OTHER): Payer: Self-pay

## 2022-03-27 DIAGNOSIS — I10 Essential (primary) hypertension: Secondary | ICD-10-CM

## 2022-03-27 DIAGNOSIS — Z86010 Personal history of colonic polyps: Secondary | ICD-10-CM

## 2022-03-27 DIAGNOSIS — Z800 Family history of malignant neoplasm of digestive organs: Secondary | ICD-10-CM

## 2022-03-27 MED ORDER — PEG 3350-KCL-NA BICARB-NACL 420 G PO SOLR: 4000.0000 mL | ORAL | 0 refills | Status: DC

## 2022-03-27 NOTE — Telephone Encounter (Signed)
Jasmine Potter Jasmine Potter Jasmine Potter, CMA  ?

## 2022-03-27 NOTE — Telephone Encounter (Signed)
Referring MD/PCP: [place]  Procedure: tcs  Reason/Indication:  hx of colon polyps, fam  hx of colon ca  Has patient had this procedure before?  yes  If so, when, by whom and where?  2020  Is there a family history of colon cancer?  Yes   Who?  What age when diagnosed?  Brother & Grandmother  Is patient diabetic? If yes, Type 1 or Type 2   no      Does patient have prosthetic heart valve or mechanical valve?  no  Do you have a pacemaker/defibrillator?  no  Has patient ever had endocarditis/atrial fibrillation? no  Does patient use oxygen? no  Has patient had joint replacement within last 12 months?  no  Is patient constipated or do they take laxatives? no  Does patient have a history of alcohol/drug use?  no  Have you had a stroke/heart attack last 6 mths? no  Do you take medicine for weight loss?  no  For female patients,: have you had a hysterectomy no                      are you post menopausal yes                      do you still have your menstrual cycle no  Is patient on blood thinner such as Coumadin, Plavix and/or Aspirin?yes  Medications: asa 81 mg daily, prozac 20 mg tid, atenolol 50 mg daily, chlorthalidone 25 mg daily, amphetamine/dextroamphetamine 20 mg bid  Allergies: nkda  Medication Adjustment per Dr Jenetta Downer none  Procedure date & time: 04/23/22 at 215

## 2022-04-22 ENCOUNTER — Encounter (INDEPENDENT_AMBULATORY_CARE_PROVIDER_SITE_OTHER): Payer: Self-pay

## 2022-04-22 ENCOUNTER — Other Ambulatory Visit (INDEPENDENT_AMBULATORY_CARE_PROVIDER_SITE_OTHER): Payer: Self-pay

## 2022-04-22 DIAGNOSIS — Z86010 Personal history of colonic polyps: Secondary | ICD-10-CM

## 2022-06-02 NOTE — Patient Instructions (Signed)
   Your procedure is scheduled on: 06/06/2022  Report to [place] Entrance at   7:15  AM.  Call this number if you have problems the morning of surgery: [phone]   Remember:              Follow Directions on the letter you received from Your Physician's office regarding the Bowel Prep              No Smoking the day of Procedure :   Take these medicines the morning of surgery with A SIP OF WATER: Atenolol, and prozac                 (xanax, oxycodone if needed)    Do not wear jewelry, make-up or nail polish.    Do not bring valuables to the hospital.  Contacts, dentures or bridgework may not be worn into surgery.  .   Patients discharged the day of surgery will not be allowed to drive home.     Colonoscopy, Adult, Care After This sheet gives you information about how to care for yourself after your procedure. Your health care provider may also give you more specific instructions. If you have problems or questions, contact your health care provider. What can I expect after the procedure? After the procedure, it is common to have: A small amount of blood in your stool for 24 hours after the procedure. Some gas. Mild abdominal cramping or bloating.  Follow these instructions at home: General instructions  For the first 24 hours after the procedure: Do not drive or use machinery. Do not sign important documents. Do not drink alcohol. Do your regular daily activities at a slower pace than normal. Eat soft, easy-to-digest foods. Rest often. Take over-the-counter or prescription medicines only as told by your health care provider. It is up to you to get the results of your procedure. Ask your health care provider, or the department performing the procedure, when your results will be ready. Relieving cramping and bloating Try walking around when you have cramps or feel bloated. Apply heat to your abdomen as told by your health care provider. Use a heat source that your  health care provider recommends, such as a moist heat pack or a heating pad. Place a towel between your skin and the heat source. Leave the heat on for 20-30 minutes. Remove the heat if your skin turns bright red. This is especially important if you are unable to feel pain, heat, or cold. You may have a greater risk of getting burned. Eating and drinking Drink enough fluid to keep your urine clear or pale yellow. Resume your normal diet as instructed by your health care provider. Avoid heavy or fried foods that are hard to digest. Avoid drinking alcohol for as long as instructed by your health care provider. Contact a health care provider if: You have blood in your stool 2-3 days after the procedure. Get help right away if: You have more than a small spotting of blood in your stool. You pass large blood clots in your stool. Your abdomen is swollen. You have nausea or vomiting. You have a fever. You have increasing abdominal pain that is not relieved with medicine. This information is not intended to replace advice given to you by your health care provider. Make sure you discuss any questions you have with your health care provider. Document Released: 06/10/2004 Document Revised: 07/21/2016 Document Reviewed: 01/08/2016 Elsevier Interactive Patient Education  [employer].

## 2022-06-03 ENCOUNTER — Other Ambulatory Visit (HOSPITAL_COMMUNITY)
Admission: RE | Admit: 2022-06-03 | Discharge: 2022-06-03 | Disposition: A | Discharge status: Home / Self Care | Payer: 59 | Source: Ambulatory Visit | Attending: Gastroenterology | Admitting: Gastroenterology

## 2022-06-03 DIAGNOSIS — I10 Essential (primary) hypertension: Secondary | ICD-10-CM | POA: Insufficient documentation

## 2022-06-03 LAB — BASIC METABOLIC PANEL
Anion gap: 8 (ref 5–15)
BUN: 11 mg/dL (ref 6–20)
CO2: 27 mmol/L (ref 22–32)
Calcium: 9.5 mg/dL (ref 8.9–10.3)
Chloride: 94 mmol/L — ABNORMAL LOW (ref 98–111)
Creatinine, Ser: 0.54 mg/dL (ref 0.44–1.00)
GFR, Estimated: >60 mL/min (ref >60)
Glucose, Bld: 87 mg/dL (ref 70–99)
Potassium: 3.5 mmol/L (ref 3.5–5.1)
Sodium: 129 mmol/L — ABNORMAL LOW (ref 135–145)

## 2022-06-04 ENCOUNTER — Encounter (HOSPITAL_COMMUNITY)
Admission: RE | Admit: 2022-06-04 | Discharge: 2022-06-04 | Disposition: A | Discharge status: Home / Self Care | Payer: 59 | Source: Ambulatory Visit | Attending: Gastroenterology | Admitting: Gastroenterology

## 2022-06-04 ENCOUNTER — Encounter (HOSPITAL_COMMUNITY): Payer: Self-pay

## 2022-06-04 DIAGNOSIS — I10 Essential (primary) hypertension: Secondary | ICD-10-CM

## 2022-06-06 ENCOUNTER — Encounter (HOSPITAL_COMMUNITY): Admission: RE | Payer: Self-pay | Source: Ambulatory Visit

## 2022-06-06 ENCOUNTER — Ambulatory Visit (HOSPITAL_COMMUNITY): Admission: RE | Admit: 2022-06-06 | Payer: 59 | Source: Ambulatory Visit | Admitting: Gastroenterology

## 2022-06-06 SURGERY — COLONOSCOPY WITH PROPOFOL
Anesthesia: Monitor Anesthesia Care

## 2022-07-29 DIAGNOSIS — G894 Chronic pain syndrome: Secondary | ICD-10-CM | POA: Diagnosis not present

## 2022-07-29 DIAGNOSIS — R69 Illness, unspecified: Secondary | ICD-10-CM | POA: Diagnosis not present

## 2022-07-29 DIAGNOSIS — M5136 Other intervertebral disc degeneration, lumbar region: Secondary | ICD-10-CM | POA: Diagnosis not present

## 2022-07-29 DIAGNOSIS — I10 Essential (primary) hypertension: Secondary | ICD-10-CM | POA: Diagnosis not present

## 2022-08-21 ENCOUNTER — Encounter (HOSPITAL_COMMUNITY): Payer: Self-pay

## 2022-08-21 ENCOUNTER — Ambulatory Visit (HOSPITAL_COMMUNITY): Admission: RE | Admit: 2022-08-21 | Payer: 59 | Source: Ambulatory Visit

## 2022-08-25 ENCOUNTER — Telehealth: Payer: Self-pay | Admitting: "Endocrinology

## 2022-08-25 ENCOUNTER — Other Ambulatory Visit: Payer: Self-pay

## 2022-08-25 DIAGNOSIS — E042 Nontoxic multinodular goiter: Secondary | ICD-10-CM

## 2022-08-25 NOTE — Progress Notes (Signed)
Lab orders updated and sent to [facility]. 

## 2022-08-25 NOTE — Telephone Encounter (Signed)
Lab orders updated and sent to [facility]. 

## 2022-08-25 NOTE — Telephone Encounter (Signed)
Can you update her lab orders?

## 2022-08-28 ENCOUNTER — Ambulatory Visit: Payer: 59 | Admitting: "Endocrinology

## 2022-09-01 ENCOUNTER — Telehealth (INDEPENDENT_AMBULATORY_CARE_PROVIDER_SITE_OTHER): Payer: Self-pay

## 2022-09-01 NOTE — Telephone Encounter (Signed)
This can be rescheduled as it was for history of colon polyps. Darius Bump, please reschedule the case. Thanks

## 2022-09-01 NOTE — Telephone Encounter (Signed)
Patient had a tcs scheduled for 06/06/2022, this was cancelled and the patient wants to know if she can reschedule this or does she need another ov first? Please advise.

## 2022-09-02 ENCOUNTER — Encounter (INDEPENDENT_AMBULATORY_CARE_PROVIDER_SITE_OTHER): Payer: Self-pay

## 2022-09-02 ENCOUNTER — Other Ambulatory Visit (INDEPENDENT_AMBULATORY_CARE_PROVIDER_SITE_OTHER): Payer: Self-pay

## 2022-09-02 DIAGNOSIS — I10 Essential (primary) hypertension: Secondary | ICD-10-CM | POA: Diagnosis not present

## 2022-09-02 DIAGNOSIS — M4316 Spondylolisthesis, lumbar region: Secondary | ICD-10-CM | POA: Diagnosis not present

## 2022-09-02 DIAGNOSIS — Z6829 Body mass index (BMI) 29.0-29.9, adult: Secondary | ICD-10-CM | POA: Diagnosis not present

## 2022-09-02 DIAGNOSIS — G894 Chronic pain syndrome: Secondary | ICD-10-CM | POA: Diagnosis not present

## 2022-09-02 DIAGNOSIS — E042 Nontoxic multinodular goiter: Secondary | ICD-10-CM | POA: Diagnosis not present

## 2022-09-02 DIAGNOSIS — R69 Illness, unspecified: Secondary | ICD-10-CM | POA: Diagnosis not present

## 2022-09-02 DIAGNOSIS — M5136 Other intervertebral disc degeneration, lumbar region: Secondary | ICD-10-CM | POA: Diagnosis not present

## 2022-09-02 DIAGNOSIS — E663 Overweight: Secondary | ICD-10-CM | POA: Diagnosis not present

## 2022-09-02 DIAGNOSIS — Z86010 Personal history of colonic polyps: Secondary | ICD-10-CM

## 2022-09-03 LAB — TSH: TSH: 0.59 u[IU]/mL (ref 0.450–4.500)

## 2022-09-03 LAB — COMPREHENSIVE METABOLIC PANEL
ALT: 16 IU/L (ref 0–32)
AST: 24 IU/L (ref 0–40)
Albumin/Globulin Ratio: 2 (ref 1.2–2.2)
Albumin: 4.3 g/dL (ref 3.8–4.9)
Alkaline Phosphatase: 60 IU/L (ref 44–121)
BUN/Creatinine Ratio: 18 (ref 9–23)
BUN: 11 mg/dL (ref 6–24)
Bilirubin Total: 0.3 mg/dL (ref 0.0–1.2)
CO2: 29 mmol/L (ref 20–29)
Calcium: 9.8 mg/dL (ref 8.7–10.2)
Chloride: 92 mmol/L — ABNORMAL LOW (ref 96–106)
Creatinine, Ser: 0.62 mg/dL (ref 0.57–1.00)
Globulin, Total: 2.2 g/dL (ref 1.5–4.5)
Glucose: 81 mg/dL (ref 70–99)
Potassium: 4 mmol/L (ref 3.5–5.2)
Sodium: 131 mmol/L — ABNORMAL LOW (ref 134–144)
Total Protein: 6.5 g/dL (ref 6.0–8.5)
eGFR: 103 mL/min/{1.73_m2} (ref >59)

## 2022-09-03 LAB — T3, FREE: T3, Free: 3.7 pg/mL (ref 2.0–4.4)

## 2022-09-03 LAB — PTH, INTACT AND CALCIUM: PTH: 27 pg/mL (ref 15–65)

## 2022-09-03 LAB — T4, FREE: Free T4: 1.52 ng/dL (ref 0.82–1.77)

## 2022-09-11 ENCOUNTER — Ambulatory Visit (HOSPITAL_COMMUNITY)
Admission: RE | Admit: 2022-09-11 | Discharge: 2022-09-11 | Disposition: A | Discharge status: Home / Self Care | Payer: 59 | Source: Ambulatory Visit | Attending: "Endocrinology | Admitting: "Endocrinology

## 2022-09-11 DIAGNOSIS — E042 Nontoxic multinodular goiter: Secondary | ICD-10-CM | POA: Diagnosis not present

## 2022-09-11 DIAGNOSIS — E041 Nontoxic single thyroid nodule: Secondary | ICD-10-CM | POA: Diagnosis not present

## 2022-09-16 ENCOUNTER — Ambulatory Visit: Payer: 59 | Admitting: "Endocrinology

## 2022-09-24 ENCOUNTER — Encounter (HOSPITAL_COMMUNITY): Admission: RE | Payer: Self-pay | Source: Home / Self Care

## 2022-09-24 ENCOUNTER — Telehealth: Payer: Self-pay | Admitting: *Deleted

## 2022-09-24 ENCOUNTER — Ambulatory Visit (HOSPITAL_COMMUNITY): Admission: RE | Admit: 2022-09-24 | Payer: 59 | Source: Home / Self Care | Admitting: Gastroenterology

## 2022-09-24 DIAGNOSIS — Z800 Family history of malignant neoplasm of digestive organs: Secondary | ICD-10-CM

## 2022-09-24 DIAGNOSIS — Z86010 Personal history of colonic polyps: Secondary | ICD-10-CM

## 2022-09-24 SURGERY — COLONOSCOPY WITH PROPOFOL
Anesthesia: Monitor Anesthesia Care

## 2022-09-24 NOTE — Telephone Encounter (Signed)
Received message from endo patient did not show up this morning for procedure and was not able to reach her.   Called pt, LMOVM to call back

## 2022-09-25 DIAGNOSIS — I10 Essential (primary) hypertension: Secondary | ICD-10-CM | POA: Diagnosis not present

## 2022-09-25 DIAGNOSIS — M4316 Spondylolisthesis, lumbar region: Secondary | ICD-10-CM | POA: Diagnosis not present

## 2022-09-25 DIAGNOSIS — R69 Illness, unspecified: Secondary | ICD-10-CM | POA: Diagnosis not present

## 2022-09-25 DIAGNOSIS — M5136 Other intervertebral disc degeneration, lumbar region: Secondary | ICD-10-CM | POA: Diagnosis not present

## 2022-10-09 ENCOUNTER — Encounter (INDEPENDENT_AMBULATORY_CARE_PROVIDER_SITE_OTHER): Payer: Self-pay | Admitting: *Deleted

## 2022-10-09 MED ORDER — PEG 3350-KCL-NA BICARB-NACL 420 G PO SOLR: 4000.0000 mL | Freq: Once | ORAL | 0 refills | Status: AC

## 2022-10-09 NOTE — Telephone Encounter (Signed)
Called pt, LMOVM to call back. 

## 2022-10-09 NOTE — Addendum Note (Signed)
Addended by: Cheron Every on: 10/09/2022 04:00 PM   Modules accepted: Orders

## 2022-10-09 NOTE — Telephone Encounter (Signed)
Pt called in. She was triage back in May and has cancelled procedure x 3. This previous time she stated when it was time for her to go to the hospital she was still going to the bathroom and knew she wouldn't make it. She stated she would keep this next appt if she could get rescheduled. She is also asking if she can do her prep the day prior since she was still going the morning of procedure with doing 2nd half that morning. Please advise thanks!

## 2022-10-09 NOTE — Telephone Encounter (Signed)
Hi Mindy, Let's reschedule her again but advise her that if she does not show up or cancels in a short timeframe, we will not be able to reschedule her in the future.  She can try taking everything the day before but if she is not clean next day then we will need to repeat a colonoscopy in a short interval. It is normal that patient still have the urge to have a BM on the same morning and some of them still have a bowel movement while waiting in the preop area. If she wants to take it all the day before, she needs to start at 8 PM her prep and finish by 11 PM.  Thanks

## 2022-10-09 NOTE — Telephone Encounter (Signed)
Spoke with pt. She has been rescheduled to 1/12 at 12:15pm. She is aware of Dr. Colman Cater recs below. New instructions sent. Aware needs BMET prior. Rx sent to [facility] per pt request. Also states will have same insurance next year

## 2022-10-27 DIAGNOSIS — M4316 Spondylolisthesis, lumbar region: Secondary | ICD-10-CM | POA: Diagnosis not present

## 2022-10-27 DIAGNOSIS — R69 Illness, unspecified: Secondary | ICD-10-CM | POA: Diagnosis not present

## 2022-10-27 DIAGNOSIS — I10 Essential (primary) hypertension: Secondary | ICD-10-CM | POA: Diagnosis not present

## 2022-10-27 DIAGNOSIS — G894 Chronic pain syndrome: Secondary | ICD-10-CM | POA: Diagnosis not present

## 2022-11-11 ENCOUNTER — Other Ambulatory Visit (HOSPITAL_COMMUNITY)
Admission: RE | Admit: 2022-11-11 | Discharge: 2022-11-11 | Disposition: A | Discharge status: Home / Self Care | Payer: 59 | Source: Ambulatory Visit | Attending: Gastroenterology | Admitting: Gastroenterology

## 2022-11-11 DIAGNOSIS — Z86010 Personal history of colonic polyps: Secondary | ICD-10-CM | POA: Insufficient documentation

## 2022-11-11 DIAGNOSIS — Z800 Family history of malignant neoplasm of digestive organs: Secondary | ICD-10-CM | POA: Diagnosis not present

## 2022-11-11 LAB — BASIC METABOLIC PANEL
Anion gap: 7 (ref 5–15)
BUN: 13 mg/dL (ref 6–20)
CO2: 28 mmol/L (ref 22–32)
Calcium: 9.5 mg/dL (ref 8.9–10.3)
Chloride: 97 mmol/L — ABNORMAL LOW (ref 98–111)
Creatinine, Ser: 0.49 mg/dL (ref 0.44–1.00)
GFR, Estimated: >60 mL/min (ref >60)
Glucose, Bld: 79 mg/dL (ref 70–99)
Potassium: 3.3 mmol/L — ABNORMAL LOW (ref 3.5–5.1)
Sodium: 132 mmol/L — ABNORMAL LOW (ref 135–145)

## 2022-11-18 ENCOUNTER — Other Ambulatory Visit (INDEPENDENT_AMBULATORY_CARE_PROVIDER_SITE_OTHER): Payer: Self-pay | Admitting: Gastroenterology

## 2022-11-18 ENCOUNTER — Telehealth (INDEPENDENT_AMBULATORY_CARE_PROVIDER_SITE_OTHER): Payer: Self-pay

## 2022-11-18 DIAGNOSIS — E876 Hypokalemia: Secondary | ICD-10-CM

## 2022-11-18 MED ORDER — POTASSIUM CHLORIDE CRYS ER 20 MEQ PO TBCR: 40.0000 meq | EXTENDED_RELEASE_TABLET | Freq: Every day | ORAL | 0 refills | Status: AC

## 2022-11-20 ENCOUNTER — Encounter: Payer: Self-pay | Admitting: *Deleted

## 2022-11-20 ENCOUNTER — Telehealth: Payer: Self-pay | Admitting: *Deleted

## 2022-11-20 ENCOUNTER — Other Ambulatory Visit (HOSPITAL_COMMUNITY): Payer: Self-pay | Admitting: Internal Medicine

## 2022-11-20 DIAGNOSIS — Z6827 Body mass index (BMI) 27.0-27.9, adult: Secondary | ICD-10-CM | POA: Diagnosis not present

## 2022-11-20 DIAGNOSIS — M5136 Other intervertebral disc degeneration, lumbar region: Secondary | ICD-10-CM | POA: Diagnosis not present

## 2022-11-20 DIAGNOSIS — I10 Essential (primary) hypertension: Secondary | ICD-10-CM | POA: Diagnosis not present

## 2022-11-20 DIAGNOSIS — F909 Attention-deficit hyperactivity disorder, unspecified type: Secondary | ICD-10-CM | POA: Diagnosis not present

## 2022-11-20 DIAGNOSIS — G40309 Generalized idiopathic epilepsy and epileptic syndromes, not intractable, without status epilepticus: Secondary | ICD-10-CM | POA: Diagnosis not present

## 2022-11-20 DIAGNOSIS — R69 Illness, unspecified: Secondary | ICD-10-CM | POA: Diagnosis not present

## 2022-11-20 DIAGNOSIS — M25512 Pain in left shoulder: Secondary | ICD-10-CM | POA: Diagnosis not present

## 2022-11-20 DIAGNOSIS — Z9229 Personal history of other drug therapy: Secondary | ICD-10-CM | POA: Diagnosis not present

## 2022-11-20 DIAGNOSIS — E876 Hypokalemia: Secondary | ICD-10-CM | POA: Diagnosis not present

## 2022-11-20 DIAGNOSIS — E663 Overweight: Secondary | ICD-10-CM | POA: Diagnosis not present

## 2022-11-20 NOTE — Telephone Encounter (Signed)
Pt was recently admitted to ICU and she is having to reschedule her colonoscopy for the third time. Will she need an OV or is it ok to reschedule her? Please advise. Thank you

## 2022-11-20 NOTE — Telephone Encounter (Signed)
Noted.  Pt has been informed that we will call her in 6 weeks to reschedule her procedure. Verbalized understanding.

## 2022-11-20 NOTE — Telephone Encounter (Signed)
Seems she is admitted with recurrent seizure diagnosis after stopping her medications. No need to see her in the office but can we try to reach her in 6 weeks and see if we can schedule her 3 months for now? Thanks

## 2022-11-21 ENCOUNTER — Ambulatory Visit (HOSPITAL_COMMUNITY): Admission: RE | Admit: 2022-11-21 | Payer: 59 | Source: Ambulatory Visit | Admitting: Gastroenterology

## 2022-11-21 ENCOUNTER — Encounter (HOSPITAL_COMMUNITY): Admission: RE | Payer: Self-pay | Source: Ambulatory Visit

## 2022-11-21 SURGERY — COLONOSCOPY WITH PROPOFOL
Anesthesia: Monitor Anesthesia Care

## 2022-11-25 NOTE — Telephone Encounter (Signed)
Message sent to ann to add to recall to schedule in 3 months

## 2022-12-05 ENCOUNTER — Ambulatory Visit (HOSPITAL_COMMUNITY)
Admission: RE | Admit: 2022-12-05 | Discharge: 2022-12-05 | Disposition: A | Discharge status: Home / Self Care | Payer: 59 | Source: Ambulatory Visit | Attending: Internal Medicine | Admitting: Internal Medicine

## 2022-12-05 ENCOUNTER — Other Ambulatory Visit (HOSPITAL_COMMUNITY): Payer: Self-pay | Admitting: Internal Medicine

## 2022-12-05 DIAGNOSIS — T50905A Adverse effect of unspecified drugs, medicaments and biological substances, initial encounter: Secondary | ICD-10-CM | POA: Diagnosis not present

## 2022-12-05 DIAGNOSIS — G894 Chronic pain syndrome: Secondary | ICD-10-CM | POA: Diagnosis not present

## 2022-12-05 DIAGNOSIS — M25512 Pain in left shoulder: Secondary | ICD-10-CM

## 2022-12-05 DIAGNOSIS — R69 Illness, unspecified: Secondary | ICD-10-CM | POA: Diagnosis not present

## 2022-12-05 DIAGNOSIS — Z9229 Personal history of other drug therapy: Secondary | ICD-10-CM | POA: Diagnosis not present

## 2022-12-05 DIAGNOSIS — E782 Mixed hyperlipidemia: Secondary | ICD-10-CM | POA: Diagnosis not present

## 2022-12-05 DIAGNOSIS — M79622 Pain in left upper arm: Secondary | ICD-10-CM | POA: Diagnosis not present

## 2022-12-05 DIAGNOSIS — M19012 Primary osteoarthritis, left shoulder: Secondary | ICD-10-CM | POA: Diagnosis not present

## 2022-12-05 DIAGNOSIS — Z6827 Body mass index (BMI) 27.0-27.9, adult: Secondary | ICD-10-CM | POA: Diagnosis not present

## 2022-12-05 DIAGNOSIS — M5136 Other intervertebral disc degeneration, lumbar region: Secondary | ICD-10-CM | POA: Diagnosis not present

## 2022-12-05 DIAGNOSIS — G40309 Generalized idiopathic epilepsy and epileptic syndromes, not intractable, without status epilepticus: Secondary | ICD-10-CM | POA: Diagnosis not present

## 2022-12-05 DIAGNOSIS — E876 Hypokalemia: Secondary | ICD-10-CM | POA: Diagnosis not present

## 2022-12-05 DIAGNOSIS — I10 Essential (primary) hypertension: Secondary | ICD-10-CM | POA: Diagnosis not present

## 2022-12-09 ENCOUNTER — Encounter (HOSPITAL_COMMUNITY): Payer: Self-pay | Admitting: Internal Medicine

## 2022-12-09 ENCOUNTER — Other Ambulatory Visit (HOSPITAL_COMMUNITY): Payer: Self-pay | Admitting: Internal Medicine

## 2022-12-09 DIAGNOSIS — Z1231 Encounter for screening mammogram for malignant neoplasm of breast: Secondary | ICD-10-CM

## 2022-12-15 ENCOUNTER — Ambulatory Visit (HOSPITAL_COMMUNITY)
Admission: RE | Admit: 2022-12-15 | Discharge: 2022-12-15 | Disposition: A | Discharge status: Home / Self Care | Payer: 59 | Source: Ambulatory Visit | Attending: Internal Medicine | Admitting: Internal Medicine

## 2022-12-15 DIAGNOSIS — Z1231 Encounter for screening mammogram for malignant neoplasm of breast: Secondary | ICD-10-CM | POA: Diagnosis not present

## 2023-01-01 ENCOUNTER — Ambulatory Visit: Payer: 59 | Admitting: Neurology

## 2023-01-01 ENCOUNTER — Encounter: Payer: Self-pay | Admitting: Neurology

## 2023-01-01 VITALS — BP 122/75 | HR 62 | Ht 61.0 in | Wt 177.0 lb

## 2023-01-01 DIAGNOSIS — E876 Hypokalemia: Secondary | ICD-10-CM

## 2023-01-01 DIAGNOSIS — Z79899 Other long term (current) drug therapy: Secondary | ICD-10-CM

## 2023-01-01 DIAGNOSIS — R631 Polydipsia: Secondary | ICD-10-CM | POA: Diagnosis not present

## 2023-01-01 DIAGNOSIS — R569 Unspecified convulsions: Secondary | ICD-10-CM | POA: Diagnosis not present

## 2023-01-01 NOTE — Progress Notes (Signed)
Subjective:    Patient ID: Jasmine Potter is a 57 y.o. female.  HPI    Star Age, MD, PhD [facility] [address] [place], [place] [address]  Dear Dr. Gerarda Fraction,  I saw your patient, Jasmine Potter, upon your kind request in my neurologic clinic today for initial consultation of her seizure.  The patient is unaccompanied today.  Please note, she reports that she had asked her brother to drive her but then she drove herself, 30-minute commute, she indicates that she was not aware that she should not be driving and indicates that she was not told at the hospital that she should not be driving.  Of note, she is agreeable to calling her brother to pick her up today and to bring another driver's so they can bring her in her car back.    As you know, Jasmine Potter is a 57 year old female with an underlying medical history of hypertension, osteoarthritis, back pain, ADD, depression, and obesity, who reports no further seizures or seizure-like events, she reports that she had 4 back-to-back seizures and that she was hospitalized in the ICU.  She reports that she had not been eating well, her potassium was low, she denies abruptly stopping Xanax but reports that she had reduced it and it was the second day.  She currently takes Xanax 1 mg at bedtime, it is written for 1 mg 3 times daily.  She also reports taking much less oxycodone than she is written for, it is written for 15 mg 6 times a day as needed but she reports that she usually only takes 1 a day.  She has not taken her Adderall since her seizures in January.  She is no longer on Keppra, she was taken off of it about 2 weeks ago without obvious repercussions, potassium has improved.  Of note, she has soreness in her left shoulder.  She had an x-ray for this.  She denies drinking any alcohol, she quit smoking cigarettes some 8 or 9 years ago but vapes daily.  She works as a Charity fundraiser.  She has seen  rheumatology last year for arthritis and reports that she has rheumatoid arthritis but she is not on any prescription medication for this.  She endorses drinking quite a bit of water, 16.9 or 20 ounce bottles, about 5 or 6/day on average.  She limits her caffeine to 2 cups of coffee in the mornings.  I reviewed your office visit note from 11/18/2022.  Of note, per your records, she is currently on oxycodone 15 mg 6 times a day as needed, Adderall 20 mg twice daily, alprazolam 1 mg 3 times daily, but it looks like it was reduced to 1 mg at bedtime recently.  She was on Keppra 1000 mg twice daily.  She is also on hydroxyzine 25 mg strength 1 to 2 pills at bedtime, fluoxetine 60 mg daily, baby aspirin, Crestor, and chlorthalidone.  She presented to the emergency room at [facility] on 11/15/2022 after seizure episodes.  She was reported to have a single witnessed seizure, this was felt to be a provoked event after stopping her long-term alprazolam suddenly.  I reviewed the emergency room and hospital records.  She was advised to resume her Xanax, she was started on Keppra and advised to follow-up as an outpatient, Keppra dose was 1000 mg twice daily, Xanax was 1 mg 3 times daily as needed.  Urine drug screen on 11/16/2022 was positive  for benzodiazepine, cannabinoids, and oxycodone.  He had a teleneurology consult.    She had a brain MRI without contrast on 11/17/2022 and I reviewed the results: Impression: Assessment is markedly limited due to the degree of motion artifact.  Repeat imaging could not be performed due to patient refusal.  1. Within the limitations of a motion degraded exam, no evidence of  an acute intracranial process.  2. Multiple periventricular T2/FLAIR hyperintense lesions, some  which have a triangular appearance. While these could represent  chronic microvascular ischemic change, this appearance could also be  seen in the setting of a demyelinating disease. Correlate with   history.   She had a CT angiogram of the head and neck with contrast on 11/16/2022 and I reviewed the results: Impression: 1. No intracranial large vessel occlusion or significant stenosis.  2. No hemodynamically significant stenosis in the neck.   She had a head CT without contrast on 11/15/2022 and I reviewed the results: Impression: No evidence of acute intracranial abnormality. ASPECTS is 10.  Laboratory testing had shown hypokalemia at 2.6 on 11/15/2022, she also had mildly low sodium level.  Magnesium was below normal at 1.5 on 11/17/2022.  Her A1c was 5.3 on 11/17/2022.  I had evaluated her over 3 years ago for gait disorder.   I had recommended a sleep study but she did not pursue sleep testing at the time and did not return phone calls from the sleep lab.  Previously:  09/20/2019: 57 year old right-handed woman with an underlying medical history of spondylosis, lumbar spinal stenosis, osteoarthritis, hypertension, depression, ADD, insomnia, and chronic pain on chronic narcotic pain medication, who reports a longer standing issue with her balance, this has become worse in the recent past.  She feels that she gets pulled into one direction, not consistently feeling lightheaded or vertiginous.  She has not fallen.  She denies any radiating back pain.  She denies any history of one-sided weakness or numbness or tingling.  She does have a history of Bell's palsy.  She does not take her oxycodone on a scheduled basis, averages about 1/day.  She also takes less Adderall than prescribed, reports taking 1 a day.  She admits that she does not hydrate very well with water.  She estimates that she drinks about 2 to 3 cups of water per day, drinks tea about 2 to 3 cups/day and coffee 1 cup/day.  She does not sleep well.  She has a longstanding history of difficulty falling asleep and staying asleep.  She is no longer on trazodone, no longer on clonazepam.  She does take Ambien 10 mg each night.  In addition, she  is on oxycodone, and Adderall.  She is on Prozac, no longer on Lexapro.  She sees a Restaurant manager, fast food but has not seen a spine specialist in some years.  She reports having had a lumbar spine MRI, I could not review it. I reviewed your virtual visit office note from 08/01/2019.  She is divorced, she lives alone.  She works as a Quarry manager, she is hoping to get her certification for [employer].  She currently works as a Neurosurgeon.  She has nocturia about twice per average night.  Her Epworth sleepiness score is 23 out of 24, fatigue severity score is 17 out of 63.  She has never had a sleep study.      Her Past Medical History Is Significant For: Past Medical History:  Diagnosis Date   ADD (attention deficit disorder)    Back  pain    Chronic back pain    Depression    Hypertension    OA (osteoarthritis)    Pneumonia    Spondylosis     Her Past Surgical History Is Significant For: Past Surgical History:  Procedure Laterality Date   APPENDECTOMY     CATARACT EXTRACTION W/ INTRAOCULAR LENS IMPLANT     left eye   CESAREAN SECTION  1991   CHOLECYSTECTOMY     COLONOSCOPY W/ BIOPSIES AND POLYPECTOMY     HEMORRHOID SURGERY     INTRAOCULAR LENS REMOVAL Left 07/16/2017   Procedure: REMOVAL OF INTRAOCULAR LENS;  Surgeon: Jalene Mullet, MD;  Location: [place];  Service: Ophthalmology;  Laterality: Left;   LASER PHOTO ABLATION Left 07/16/2017   Procedure: LASER PHOTO ABLATION;  Surgeon: Jalene Mullet, MD;  Location: [place];  Service: Ophthalmology;  Laterality: Left;   PARS PLANA VITRECTOMY Left 07/16/2017   Procedure: PARS PLANA VITRECTOMY WITH 25 GAUGE WITH PLACEMENT OF INTRAOCULAR LENS;  Surgeon: Jalene Mullet, MD;  Location: [place];  Service: Ophthalmology;  Laterality: Left;   TUBAL LIGATION      Her Family History Is Significant For: Family History  Problem Relation Age of Onset   Breast cancer Mother    Heart attack Father    CAD Father    Leukemia Sister    Breast cancer Sister    Colon  cancer Brother    Liver cancer Brother    Colon cancer Paternal Grandmother    Seizures 64    Spina bifida Cousin     Her Social History Is Significant For: Social History   Socioeconomic History   Marital status: Divorced    Spouse name: Not on file   Number of children: Not on file   Years of education: Not on file   Highest education level: Not on file  Occupational History   Not on file  Tobacco Use   Smoking status: Former    Types: Cigarettes    Quit date: 2014    Years since quitting: 10.1   Smokeless tobacco: Never   Tobacco comments:    quit smoking cigarettes in 2014  Vaping Use   Vaping Use: Every day   Substances: Nicotine  Substance and Sexual Activity   Alcohol use: No   Drug use: No   Sexual activity: Not Currently    Birth control/protection: Surgical, Post-menopausal    Comment: tubal  Other Topics Concern   Not on file  Social History Narrative   ** Merged History Encounter **       Social Determinants of Health   Financial Resource Strain: Low Risk  (09/12/2021)   Overall Financial Resource Strain (CARDIA)    Difficulty of Paying Living Expenses: Not hard at all  Food Insecurity: No Food Insecurity (09/12/2021)   Hunger Vital Sign    Worried About Running Out of Food in the Last Year: Never true    [place] in the Last Year: Never true  Transportation Needs: No Transportation Needs (09/12/2021)   PRAPARE - Hydrologist (Medical): No    Lack of Transportation (Non-Medical): No  Physical Activity: Insufficiently Active (09/12/2021)   Exercise Vital Sign    Days of Exercise per Week: 2 days    Minutes of Exercise per Session: 20 min  Stress: No Stress Concern Present (09/12/2021)   [place]    Feeling of Stress : Not at all  Social Connections: Moderately Isolated (09/12/2021)   Social Connection and Isolation Panel [NHANES]    Frequency  of Communication with Friends and Family: Once a week    Frequency of Social Gatherings with Friends and Family: More than three times a week    Attends Religious Services: 1 to 4 times per year    Active Member of [employer] or Organizations: No    Attends Archivist Meetings: Never    Marital Status: Divorced    Her Allergies Are:  No Known Allergies:   Her Current Medications Are:  Outpatient Encounter Medications as of 01/01/2023  Medication Sig   ALPRAZolam (XANAX) 1 MG tablet Take 1 mg by mouth daily as needed for anxiety.   amphetamine-dextroamphetamine (ADDERALL) 20 MG tablet Take 20 mg by mouth daily at 6 (six) AM.   aspirin EC 81 MG tablet Take 81 mg by mouth at bedtime.   atenolol (TENORMIN) 50 MG tablet Take 50 mg by mouth in the morning.   CALCIUM PO Take 2 capsules by mouth in the morning. Living calcium advanced   chlorthalidone (HYGROTON) 25 MG tablet Take 25 mg by mouth in the morning.   Coenzyme Q10 (COQ10 PO) Take 1 capsule by mouth in the morning.   Cyanocobalamin (VITAMIN B-12 PO) Take 1 tablet by mouth in the morning.   diclofenac Sodium (VOLTAREN ARTHRITIS PAIN) 1 % GEL Apply 2 g topically 4 (four) times daily as needed (pain).   FLUoxetine (PROZAC) 20 MG capsule Take 60 mg by mouth in the morning.   Garlic (GARLIQUE PO) Take 1 capsule by mouth every evening.   GRAPE SEED EXTRACT PO Take 200 mg by mouth in the morning.   Omega-3 Fatty Acids (FISH OIL PO) Take 1 capsule by mouth in the morning and at bedtime.   OVER THE COUNTER MEDICATION Take 2 tablets by mouth in the morning. Reishi mushroom   oxyCODONE (ROXICODONE) 15 MG immediate release tablet Take 15 mg by mouth 2 (two) times daily as needed for pain.   Polyethyl Glycol-Propyl Glycol (LUBRICANT EYE DROPS) 0.4-0.3 % SOLN Place 1-2 drops into both eyes 3 (three) times daily as needed (dry/irritated eyes.).   TURMERIC CURCUMIN PO Take 3 capsules by mouth at bedtime.   levETIRAcetam (KEPPRA) 1000 MG tablet  Take 1,000 mg by mouth 2 (two) times daily.   Misc Natural Products (CHOLESTEROL SUPPORT PO) Take 2 capsules by mouth in the morning and at bedtime. UpNourish Citrus Bergamot Cholesterol Supplement   polyethylene glycol-electrolytes (TRILYTE) 420 g solution Take 4,000 mLs by mouth as directed.   potassium chloride SA (KLOR-CON M) 20 MEQ tablet Take 2 tablets (40 mEq total) by mouth daily for 3 days.   No facility-administered encounter medications on file as of 01/01/2023.  :   Review of Systems:  Out of a complete 14 point review of systems, all are reviewed and negative with the exception of these symptoms as listed below:    Review of Systems  Neurological:        Pt here for seizures   Pt passed out on Nov 15 2022 woke up in ICU Pt states was told she had 4 seizures Pt was placed on keppra and PCP took her off medication to weeks later     Objective:  Neurological Exam  Physical Exam Physical Examination:   Vitals:   01/01/23 0822  BP: 122/75  Pulse: 62    General Examination: The patient is a very pleasant 57 y.o. female in no  acute distress. She appears well-developed and well-nourished and well groomed.   HEENT: Normocephalic, atraumatic, pupils are equal, round and reactive to light. Extraocular tracking is well preserved, face is symmetric with normal facial animation and normal facial sensation, speech is clear without dysarthria, hypophonia or voice tremor, neck is supple, no carotid bruits.  Airway examination reveals moderate to severe mouth dryness, adequate dental hygiene, dentures on top.  Tongue protrudes centrally and palate elevates symmetrically, small airway noted.   Chest: Clear to auscultation without wheezing, rhonchi or crackles noted.   Heart: S1+S2+0, regular and normal without murmurs, rubs or gallops noted.    Abdomen: Soft, non-tender and non-distended.   Extremities: There is no pitting edema in the distal lower extremities bilaterally. Pedal  pulses are intact.   Skin: Warm and dry without trophic changes noted.    Musculoskeletal: exam reveals prominent arthritic changes and ulnar deviation in both hands.  Limited range of motion left shoulder.  Increased forward curvature in the upper back.   Neurologically:  Mental status: The patient is awake, alert and oriented in all 4 spheres. Her immediate and remote memory, attention, language skills and fund of knowledge are appropriate. There is no evidence of aphasia, agnosia, apraxia or anomia. Speech is clear with normal prosody and enunciation. Thought process is linear. Mood is normal and affect is normal.  Cranial nerves II - XII are as described above under HEENT exam. In addition: Left shoulder is slightly higher than right shoulder, she also has increase in thoracic kyphosis.  Motor exam: Normal bulk, strength and tone is noted. There is no drift, tremor or rebound. Romberg is negative. Reflexes are 1+ throughout. Fine motor skills and coordination: Preserved in the upper extremities, slightly difficult with hand movements.   Cerebellar testing: No dysmetria or intention tremor on finger to nose testing. Heel to shin is unremarkable bilaterally. There is no truncal or gait ataxia.  Sensory exam: intact to light touch.  Gait, station and balance: She stands easily. No veering to one side is noted. No leaning to one side is noted. Posture is age-appropriate and stance is narrow based. Gait shows normal stride length and normal pace. No problems turning are noted. Tandem walk is unremarkable.    Assessment and Plan:  In summary, Jasmine Potter is a 57 year old female with an underlying medical history of hypertension, osteoarthritis, back pain, ADD, depression, and obesity, who presents for evaluation of seizures that happened in early January, suspected provoked seizures in the context of benzodiazepine cessation, also hypokalemia.  She reports that she had reduced her Xanax and it  happened the second day, she also reports that she was not eating well at the time.  She does report drinking quite a bit of water and we talked about hyperhydration and electrolyte dilution today.  She has not had any further seizures, she is successfully stopped the [place] a couple of weeks ago but is strongly advised not to drive, not to use heavy machinery, not be at heights or use any other dangerous equipment for at least 6 months.  So long as she stays seizure-free she can resume driving after 6 months of the seizure event.  We talked about certain medications that can lower seizure threshold.  She is advised to talk to her about medication adjustments, slowly tapering off the Xanax, reducing the Adderall or staying off of it as she has not had any since her January seizures.  She is furthermore advised to talk to you about  reducing her oxycodone as she generally only takes 1 pill once daily and it is written for up to 6 pills daily as needed.  She is encouraged to follow-up with rheumatology for her arthritis pain and rheumatoid arthritis diagnosis.  She is advised to limit her water intake to 3-4 bottles of water per day, either 16.9 ounce size or 20 ounce size but no more than that.  She is advised to follow-up with you to check her potassium level again.  She has taken potassium supplementation.  We will proceed with an EEG.  She had a brain MRI in the hospital.  Her neurological exam is nonfocal.  She is advised to ask her brother to pick her up today as she has a 30-minute drive home and she is not supposed to drive.  She was agreeable, she is advised to follow-up in this clinic routinely for a recheck in 6 months to see one of our nurse practitioners.  I answered all her questions today and she was in agreement.   This was a prolonged visit of over 1 hour with extended chart review and counseling involved.  Thank you very much for allowing me to participate in the care of this nice patient. If I can  be of any further assistance to you please do not hesitate to call me at [phone].   Sincerely,     Star Age, MD, PhD

## 2023-01-01 NOTE — Patient Instructions (Addendum)
I do believe you had a provoked seizure situation in January.  Likely, the seizures were triggered by stopping Xanax abruptly and by low potassium.  It sounds to me that you may be over hydrating with water, drinking too much water can dilute your electrolytes, limit yourself to 3-4 bottles of water per day, 16.9 ounce size.  Avoid drinking alcohol, avoid drinking caffeine.  Avoid your stimulant, namely your Adderall.  You indicate that you have not taken any Adderall since you were in the hospital in early January 2024.  Talk to your primary care about stopping the Adderall long-term.  I would also recommend that you talk to Dr. Gerarda Fraction about reducing your Xanax slowly, it has to be done gradually as you take 1 mg which is a higher dose.   Since you are not taking oxycodone nearly as much as prescribed, talk to your primary care about reduction in the prescription, it is written for 15 mg up to 6 times a day as needed, you will only generally take 1 a day as you indicate.   As discussed, you cannot drive once you have had a seizure, this is [place] law.  You cannot drive for 6 months and I recommend that you call your brother to pick you up, he may need to bring the second driver or he may need to catch a ride to get here to bring you and your car back.  You can stay off the [place] for now as you had provoked seizures.  We will do an EEG (brainwave test), which we will schedule. We will call you with the results.  We do not need to do an MRI as you had 1 in the hospital.  Please remember, common seizure triggers are: Sleep deprivation, dehydration, overheating, stress, hypoglycemia or skipping meals, certain medications or excessive alcohol use, especially stopping alcohol abruptly if you have had heavy alcohol use before (aka alcohol withdrawal seizure). If you have a prolonged seizure over 2-5 minutes or back to back seizures, call or have someone call 911 or take you to the nearest emergency room.  You cannot drive a car or operate any other machinery or vehicle within 6 months of a seizure. Please do not swim alone or take a tub bath for safety. Do not climb on ladders or be at heights alone. Do not cook with large quantities of boiling water or oil for safety. Please ensure the water temperature at home is not too high. When carrying or caring for small children and infants, make sure you sit down when holding the child are feeding the child or changing them to minimize risk for injury to the child are to you if you were to have a seizure.   Avoid taking Wellbutrin, narcotic pain medications and tramadol, and stimulants as they can lower seizure threshold.  As per [facility] statutes, patients with seizures and epilepsy are not allowed to drive until they have been seizure-free for at least 6 months. This also applies to driving or using heavy equipment or power tools.

## 2023-01-07 ENCOUNTER — Ambulatory Visit: Payer: 59 | Admitting: Neurology

## 2023-01-26 DIAGNOSIS — G894 Chronic pain syndrome: Secondary | ICD-10-CM | POA: Diagnosis not present

## 2023-01-26 DIAGNOSIS — M5136 Other intervertebral disc degeneration, lumbar region: Secondary | ICD-10-CM | POA: Diagnosis not present

## 2023-01-26 DIAGNOSIS — F909 Attention-deficit hyperactivity disorder, unspecified type: Secondary | ICD-10-CM | POA: Diagnosis not present

## 2023-01-26 DIAGNOSIS — M4316 Spondylolisthesis, lumbar region: Secondary | ICD-10-CM | POA: Diagnosis not present

## 2023-01-27 ENCOUNTER — Telehealth (INDEPENDENT_AMBULATORY_CARE_PROVIDER_SITE_OTHER): Payer: Self-pay | Admitting: *Deleted

## 2023-01-27 NOTE — Telephone Encounter (Signed)
Please advise what room to place pt in. Thank you.

## 2023-01-27 NOTE — Telephone Encounter (Signed)
Patient is on recall to be called to schedule TCS see 11/20/22 notes from Dr Loletha Grayer

## 2023-01-27 NOTE — Telephone Encounter (Signed)
Hi Tanya,   Can you please schedule a colonoscopy? Dx: history colon polyps. Room: 3. Please also let her know that she needs to commit to coming for the day that she is scheduled, as she has canceled 3 times in the last year. If she cancels again, she will need to have her procedure performed by other provider in a different practice.  Thanks,  Maylon Peppers, MD Gastroenterology and Hepatology [facility]

## 2023-01-28 MED ORDER — CLENPIQ 10-3.5-12 MG-GM -GM/175ML PO SOLN: 1.0000 | ORAL | 0 refills | Status: DC

## 2023-01-28 NOTE — Telephone Encounter (Signed)
Pt contacted and scheduled colonoscopy for 02/17/23. Pt has trylite but would like to try Clinpiq. Prep sent to pharmacy. Pt would like instructions via my chart. Will call with pre op appt time. Will also call pt day before pre op to remind her of pre op appt.

## 2023-01-29 NOTE — Telephone Encounter (Signed)
Pre op appt: 02/13/23@ 9:30am [facility]. Pt contacted and made aware.

## 2023-02-11 NOTE — Patient Instructions (Signed)
Jasmine Potter  02/11/2023     @PREFPERIOPPHARMACY @   Your procedure is scheduled on  02/17/2023.   Report to [facility] at  1130 A.M.   Call this number if you have problems the morning of surgery:  [phone]  If you experience any cold or flu symptoms such as cough, fever, chills, shortness of breath, etc. between now and your scheduled surgery, please notify us at the above number.   Remember:  Follow the diet and prep instructions given to you by the office.    Take these medicines the morning of surgery with A SIP OF WATER         xanax(if needed), atenolol, prozac, keppra, oxycodone(if needed).     Do not wear jewelry, make-up or nail polish.  Do not wear lotions, powders, or perfumes, or deodorant.  Do not shave 48 hours prior to surgery.  Men may shave face and neck.  Do not bring valuables to the hospital.  [facility] is not responsible for any belongings or valuables.  Contacts, dentures or bridgework may not be worn into surgery.  Leave your suitcase in the car.  After surgery it may be brought to your room.  For patients admitted to the hospital, discharge time will be determined by your treatment team.  Patients discharged the day of surgery will not be allowed to drive home and must have someone with them for 24 hours.    Special instructions:   DO NOT smoke tobacco or vape for 24 hours before your procedure.  Please read over the following fact sheets that you were given. Anesthesia Post-op Instructions and Care and Recovery After Surgery      Colonoscopy, Adult, Care After The following information offers guidance on how to care for yourself after your procedure. Your health care provider may also give you more specific instructions. If you have problems or questions, contact your health care provider. What can I expect after the procedure? After the procedure, it is common to have: A small amount of blood in your stool for 24 hours after the  procedure. Some gas. Mild cramping or bloating of your abdomen. Follow these instructions at home: Eating and drinking  Drink enough fluid to keep your urine pale yellow. Follow instructions from your health care provider about eating or drinking restrictions. Resume your normal diet as told by your health care provider. Avoid heavy or fried foods that are hard to digest. Activity Rest as told by your health care provider. Avoid sitting for a long time without moving. Get up to take short walks every 1-2 hours. This is important to improve blood flow and breathing. Ask for help if you feel weak or unsteady. Return to your normal activities as told by your health care provider. Ask your health care provider what activities are safe for you. Managing cramping and bloating  Try walking around when you have cramps or feel bloated. If directed, apply heat to your abdomen as told by your health care provider. Use the heat source that your health care provider recommends, such as a moist heat pack or a heating pad. Place a towel between your skin and the heat source. Leave the heat on for 20-30 minutes. Remove the heat if your skin turns bright red. This is especially important if you are unable to feel pain, heat, or cold. You have a greater risk of getting burned. General instructions If you were given a sedative during the  procedure, it can affect you for several hours. Do not drive or operate machinery until your health care provider says that it is safe. For the first 24 hours after the procedure: Do not sign important documents. Do not drink alcohol. Do your regular daily activities at a slower pace than normal. Eat soft foods that are easy to digest. Take over-the-counter and prescription medicines only as told by your health care provider. Keep all follow-up visits. This is important. Contact a health care provider if: You have blood in your stool 2-3 days after the procedure. Get  help right away if: You have more than a small spotting of blood in your stool. You have large blood clots in your stool. You have swelling of your abdomen. You have nausea or vomiting. You have a fever. You have increasing pain in your abdomen that is not relieved with medicine. These symptoms may be an emergency. Get help right away. Call 911. Do not wait to see if the symptoms will go away. Do not drive yourself to the hospital. Summary After the procedure, it is common to have a small amount of blood in your stool. You may also have mild cramping and bloating of your abdomen. If you were given a sedative during the procedure, it can affect you for several hours. Do not drive or operate machinery until your health care provider says that it is safe. Get help right away if you have a lot of blood in your stool, nausea or vomiting, a fever, or increased pain in your abdomen. This information is not intended to replace advice given to you by your health care provider. Make sure you discuss any questions you have with your health care provider. Document Revised: 06/19/2021 Document Reviewed: 06/19/2021 Elsevier Patient Education  Jasmine Potter After The following information offers guidance on how to care for yourself after your procedure. Your health care provider may also give you more specific instructions. If you have problems or questions, contact your health care provider. What can I expect after the procedure? After the procedure, it is common to have: Tiredness. Little or no memory about what happened during or after the procedure. Impaired judgment when it comes to making decisions. Nausea or vomiting. Some trouble with balance. Follow these instructions at home: For the time period you were told by your health care provider:  Rest. Do not participate in activities where you could fall or become injured. Do not drive or use machinery. Do  not drink alcohol. Do not take sleeping pills or medicines that cause drowsiness. Do not make important decisions or sign legal documents. Do not take care of children on your own. Medicines Take over-the-counter and prescription medicines only as told by your health care provider. If you were prescribed antibiotics, take them as told by your health care provider. Do not stop using the antibiotic even if you start to feel better. Eating and drinking Follow instructions from your health care provider about what you may eat and drink. Drink enough fluid to keep your urine pale yellow. If you vomit: Drink clear fluids slowly and in small amounts as you are able. Clear fluids include water, ice chips, low-calorie sports drinks, and fruit juice that has water added to it (diluted fruit juice). Eat light and bland foods in small amounts as you are able. These foods include bananas, applesauce, rice, lean meats, toast, and crackers. General instructions  Have a responsible adult stay with you for the  time you are told. It is important to have someone help care for you until you are awake and alert. If you have sleep apnea, surgery and some medicines can increase your risk for breathing problems. Follow instructions from your health care provider about wearing your sleep device: When you are sleeping. This includes during daytime naps. While taking prescription pain medicines, sleeping medicines, or medicines that make you drowsy. Do not use any products that contain nicotine or tobacco. These products include cigarettes, chewing tobacco, and vaping devices, such as e-cigarettes. If you need help quitting, ask your health care provider. Contact a health care provider if: You feel nauseous or vomit every time you eat or drink. You feel light-headed. You are still sleepy or having trouble with balance after 24 hours. You get a rash. You have a fever. You have redness or swelling around the IV  site. Get help right away if: You have trouble breathing. You have new confusion after you get home. These symptoms may be an emergency. Get help right away. Call 911. Do not wait to see if the symptoms will go away. Do not drive yourself to the hospital. This information is not intended to replace advice given to you by your health care provider. Make sure you discuss any questions you have with your health care provider. Document Revised: 03/24/2022 Document Reviewed: 03/24/2022 Elsevier Patient Education  [place].

## 2023-02-13 ENCOUNTER — Encounter (HOSPITAL_COMMUNITY)
Admission: RE | Admit: 2023-02-13 | Discharge: 2023-02-13 | Disposition: A | Discharge status: Home / Self Care | Payer: 59 | Source: Ambulatory Visit | Attending: Gastroenterology | Admitting: Gastroenterology

## 2023-02-13 ENCOUNTER — Encounter (HOSPITAL_COMMUNITY): Payer: Self-pay

## 2023-02-13 VITALS — BP 112/75 | HR 57 | Temp 97.6°F | Resp 18 | Ht 61.0 in | Wt 177.0 lb

## 2023-02-13 DIAGNOSIS — Z01818 Encounter for other preprocedural examination: Secondary | ICD-10-CM | POA: Diagnosis not present

## 2023-02-13 DIAGNOSIS — I10 Essential (primary) hypertension: Secondary | ICD-10-CM

## 2023-02-13 DIAGNOSIS — Z79899 Other long term (current) drug therapy: Secondary | ICD-10-CM | POA: Diagnosis not present

## 2023-02-13 DIAGNOSIS — R001 Bradycardia, unspecified: Secondary | ICD-10-CM | POA: Diagnosis not present

## 2023-02-13 HISTORY — DX: Unspecified convulsions: R56.9

## 2023-02-13 LAB — BASIC METABOLIC PANEL
Anion gap: 9 (ref 5–15)
BUN: 14 mg/dL (ref 6–20)
CO2: 26 mmol/L (ref 22–32)
Calcium: 9.2 mg/dL (ref 8.9–10.3)
Chloride: 95 mmol/L — ABNORMAL LOW (ref 98–111)
Creatinine, Ser: 0.53 mg/dL (ref 0.44–1.00)
GFR, Estimated: >60 mL/min (ref >60)
Glucose, Bld: 95 mg/dL (ref 70–99)
Potassium: 3.6 mmol/L (ref 3.5–5.1)
Sodium: 130 mmol/L — ABNORMAL LOW (ref 135–145)

## 2023-02-17 ENCOUNTER — Encounter (HOSPITAL_COMMUNITY): Payer: Self-pay | Admitting: Gastroenterology

## 2023-02-17 ENCOUNTER — Encounter (HOSPITAL_COMMUNITY)
Admission: RE | Disposition: A | Discharge status: Home / Self Care | Payer: Self-pay | Source: Home / Self Care | Attending: Gastroenterology

## 2023-02-17 ENCOUNTER — Ambulatory Visit (HOSPITAL_BASED_OUTPATIENT_CLINIC_OR_DEPARTMENT_OTHER): Payer: 59 | Admitting: Anesthesiology

## 2023-02-17 ENCOUNTER — Ambulatory Visit (HOSPITAL_COMMUNITY)
Admission: RE | Admit: 2023-02-17 | Discharge: 2023-02-17 | Disposition: A | Discharge status: Home / Self Care | Payer: 59 | Attending: Gastroenterology | Admitting: Gastroenterology

## 2023-02-17 ENCOUNTER — Ambulatory Visit (HOSPITAL_COMMUNITY): Payer: 59 | Admitting: Anesthesiology

## 2023-02-17 DIAGNOSIS — I10 Essential (primary) hypertension: Secondary | ICD-10-CM | POA: Diagnosis not present

## 2023-02-17 DIAGNOSIS — Z1211 Encounter for screening for malignant neoplasm of colon: Secondary | ICD-10-CM | POA: Diagnosis not present

## 2023-02-17 DIAGNOSIS — Z87891 Personal history of nicotine dependence: Secondary | ICD-10-CM | POA: Diagnosis not present

## 2023-02-17 DIAGNOSIS — M1990 Unspecified osteoarthritis, unspecified site: Secondary | ICD-10-CM | POA: Diagnosis not present

## 2023-02-17 DIAGNOSIS — Z8249 Family history of ischemic heart disease and other diseases of the circulatory system: Secondary | ICD-10-CM | POA: Insufficient documentation

## 2023-02-17 DIAGNOSIS — D124 Benign neoplasm of descending colon: Secondary | ICD-10-CM

## 2023-02-17 DIAGNOSIS — R569 Unspecified convulsions: Secondary | ICD-10-CM | POA: Diagnosis not present

## 2023-02-17 DIAGNOSIS — Z800 Family history of malignant neoplasm of digestive organs: Secondary | ICD-10-CM | POA: Insufficient documentation

## 2023-02-17 DIAGNOSIS — Z86010 Personal history of colonic polyps: Secondary | ICD-10-CM

## 2023-02-17 DIAGNOSIS — K6389 Other specified diseases of intestine: Secondary | ICD-10-CM | POA: Diagnosis not present

## 2023-02-17 DIAGNOSIS — F32A Depression, unspecified: Secondary | ICD-10-CM | POA: Diagnosis not present

## 2023-02-17 DIAGNOSIS — K635 Polyp of colon: Secondary | ICD-10-CM | POA: Diagnosis not present

## 2023-02-17 DIAGNOSIS — J189 Pneumonia, unspecified organism: Secondary | ICD-10-CM | POA: Diagnosis not present

## 2023-02-17 DIAGNOSIS — F909 Attention-deficit hyperactivity disorder, unspecified type: Secondary | ICD-10-CM | POA: Insufficient documentation

## 2023-02-17 DIAGNOSIS — E0590 Thyrotoxicosis, unspecified without thyrotoxic crisis or storm: Secondary | ICD-10-CM | POA: Insufficient documentation

## 2023-02-17 HISTORY — PX: COLONOSCOPY WITH PROPOFOL: SHX5780

## 2023-02-17 HISTORY — PX: POLYPECTOMY: SHX5525

## 2023-02-17 LAB — HM COLONOSCOPY

## 2023-02-17 SURGERY — COLONOSCOPY WITH PROPOFOL
Anesthesia: General

## 2023-02-17 MED ORDER — LIDOCAINE HCL 1 % IJ SOLN
INTRAMUSCULAR | Status: DC | PRN
  Administered 2023-02-17: 50 mg via INTRADERMAL

## 2023-02-17 MED ORDER — PROPOFOL 10 MG/ML IV BOLUS
INTRAVENOUS | Status: DC | PRN
  Administered 2023-02-17 (×2): 50 mg via INTRAVENOUS

## 2023-02-17 MED ORDER — EPHEDRINE SULFATE (PRESSORS) 50 MG/ML IJ SOLN
INTRAMUSCULAR | Status: DC | PRN
  Administered 2023-02-17: 10 mg via INTRAVENOUS
  Administered 2023-02-17: 5 mg via INTRAVENOUS
  Administered 2023-02-17: 10 mg via INTRAVENOUS

## 2023-02-17 MED ORDER — PROPOFOL 500 MG/50ML IV EMUL
INTRAVENOUS | Status: DC | PRN
  Administered 2023-02-17: 150 ug/kg/min via INTRAVENOUS

## 2023-02-17 MED ORDER — LACTATED RINGERS IV SOLN
INTRAVENOUS | Status: DC
  Administered 2023-02-17: 1000 mL via INTRAVENOUS

## 2023-02-17 NOTE — Discharge Instructions (Signed)
You are being discharged to home.  Resume your previous diet.  We are waiting for your pathology results.  Your physician has recommended a repeat colonoscopy at the next available appointment (within 3 months) because the bowel preparation was poor. Will need a two day prep with Golytely or Trilyte.

## 2023-02-17 NOTE — Anesthesia Preprocedure Evaluation (Signed)
Anesthesia Evaluation  Patient identified by MRN, date of birth, ID band Patient awake    Reviewed: Allergy & Precautions, H&P , NPO status , Patient's Chart, lab work & pertinent test results, reviewed documented beta blocker date and time   Airway Mallampati: II  TM Distance: >3 FB Neck ROM: full    Dental no notable dental hx.    Pulmonary neg pulmonary ROS, pneumonia, former smoker   Pulmonary exam normal breath sounds clear to auscultation       Cardiovascular Exercise Tolerance: Good hypertension, negative cardio ROS  Rhythm:regular Rate:Normal     Neuro/Psych Seizures -,  PSYCHIATRIC DISORDERS  Depression    negative neurological ROS  negative psych ROS   GI/Hepatic negative GI ROS, Neg liver ROS,,,  Endo/Other  negative endocrine ROS Hyperthyroidism   Renal/GU negative Renal ROS  negative genitourinary   Musculoskeletal   Abdominal   Peds  Hematology negative hematology ROS (+)   Anesthesia Other Findings   Reproductive/Obstetrics negative OB ROS                             Anesthesia Physical Anesthesia Plan  ASA: 2  Anesthesia Plan: General   Post-op Pain Management:    Induction:   PONV Risk Score and Plan: Propofol infusion  Airway Management Planned:   Additional Equipment:   Intra-op Plan:   Post-operative Plan:   Informed Consent: I have reviewed the patients History and Physical, chart, labs and discussed the procedure including the risks, benefits and alternatives for the proposed anesthesia with the patient or authorized representative who has indicated his/her understanding and acceptance.     Dental Advisory Given  Plan Discussed with: CRNA  Anesthesia Plan Comments:        Anesthesia Quick Evaluation

## 2023-02-17 NOTE — Anesthesia Postprocedure Evaluation (Signed)
Anesthesia Post Note  Patient: Jasmine Potter  Procedure(s) Performed: COLONOSCOPY WITH PROPOFOL POLYPECTOMY  Patient location during evaluation: Short Stay Anesthesia Type: General Level of consciousness: awake and alert Pain management: pain level controlled Vital Signs Assessment: post-procedure vital signs reviewed and stable Respiratory status: spontaneous breathing Cardiovascular status: blood pressure returned to baseline and stable Postop Assessment: no apparent nausea or vomiting Anesthetic complications: no   No notable events documented.   Last Vitals:  Vitals:   02/17/23 0953 02/17/23 1021  BP: 98/64 (!) 77/47  Pulse:  86  Resp:  18  Temp: 36.6 C 36.7 C  SpO2: 99% 100%    Last Pain:  Vitals:   02/17/23 1021  TempSrc: Oral  PainSc: 0-No pain                 Chalmer Zheng

## 2023-02-17 NOTE — H&P (Addendum)
Jasmine Potter is an 57 y.o. female.   Chief Complaint: Family history of colon cancer and history of polyps HPI: 57 year old female with past medical history of hypertension, depression, osteoarthritis, seizures, ADHD, coming for family history of colon cancer and  history of polyps.  States her most recent colonoscopy was in 2020, had some polyps removed but no reports available.  The patient denies having any complaints such as melena, hematochezia, abdominal pain or distention, change in her bowel movement consistency or frequency, no changes in weight recently.  Patient had a grandmother with colon cancer and her brother was diagnosed with colon cancer at 6855.  Past Medical History:  Diagnosis Date   ADD (attention deficit disorder)    Back pain    Chronic back pain    Depression    Hypertension    OA (osteoarthritis)    Pneumonia    Seizure    followed up with nreuology...result of low potassium.Marland Kitchen. no further work up needed   Spondylosis     Past Surgical History:  Procedure Laterality Date   APPENDECTOMY     CATARACT EXTRACTION W/ INTRAOCULAR LENS IMPLANT     left eye   CESAREAN SECTION  1991   CHOLECYSTECTOMY     COLONOSCOPY W/ BIOPSIES AND POLYPECTOMY     HEMORRHOID SURGERY     INTRAOCULAR LENS REMOVAL Left 07/16/2017   Procedure: REMOVAL OF INTRAOCULAR LENS;  Surgeon: Carmela RimaPatel, Narendra, MD;  [facility];  Service: Ophthalmology;  Laterality: Left;   LASER PHOTO ABLATION Left 07/16/2017   Procedure: LASER PHOTO ABLATION;  Surgeon: Carmela RimaPatel, Narendra, MD;  [facility];  Service: Ophthalmology;  Laterality: Left;   PARS PLANA VITRECTOMY Left 07/16/2017   Procedure: PARS PLANA VITRECTOMY WITH 25 GAUGE WITH PLACEMENT OF INTRAOCULAR LENS;  Surgeon: Carmela RimaPatel, Narendra, MD;  [facility];  Service: Ophthalmology;  Laterality: Left;   TUBAL LIGATION      Family History  Problem Relation Age of Onset   Breast cancer Mother    Heart attack Father    CAD Father    Leukemia  Sister    Breast cancer Sister    Colon cancer Brother    Liver cancer Brother    Colon cancer Paternal Grandmother    Seizures Cousin    Spina bifida Cousin    Social History:  reports that she quit smoking about 10 years ago. Her smoking use included cigarettes. She has never used smokeless tobacco. She reports that she does not drink alcohol and does not use drugs.  Allergies: No Known Allergies  Medications Prior to Admission  Medication Sig Dispense Refill   ALPRAZolam (XANAX) 1 MG tablet Take 1 mg by mouth daily as needed for anxiety.     amphetamine-dextroamphetamine (ADDERALL) 20 MG tablet Take 20 mg by mouth daily at 6 (six) AM.     aspirin EC 81 MG tablet Take 81 mg by mouth at bedtime.     atenolol (TENORMIN) 50 MG tablet Take 50 mg by mouth in the morning.     CALCIUM PO Take 2 capsules by mouth in the morning. Living calcium advanced     chlorthalidone (HYGROTON) 25 MG tablet Take 25 mg by mouth in the morning.     Coenzyme Q10 (COQ10 PO) Take 1 capsule by mouth in the morning.     Cyanocobalamin (VITAMIN B-12 PO) Take 1 tablet by mouth in the morning.     diclofenac Sodium (VOLTAREN ARTHRITIS PAIN) 1 % GEL Apply 2 g  topically 4 (four) times daily as needed (pain).     FLUoxetine (PROZAC) 20 MG capsule Take 60 mg by mouth in the morning.     Garlic (GARLIQUE PO) Take 1 capsule by mouth every evening.     GRAPE SEED EXTRACT PO Take 200 mg by mouth in the morning.     Misc Natural Products (CHOLESTEROL SUPPORT PO) Take 2 capsules by mouth in the morning and at bedtime. UpNourish Citrus Bergamot Cholesterol Supplement     Omega-3 Fatty Acids (FISH OIL PO) Take 1 capsule by mouth in the morning and at bedtime.     OVER THE COUNTER MEDICATION Take 2 tablets by mouth in the morning. Reishi mushroom     oxyCODONE (ROXICODONE) 15 MG immediate release tablet Take 15 mg by mouth 2 (two) times daily as needed for pain.     Polyethyl Glycol-Propyl Glycol (LUBRICANT EYE DROPS) 0.4-0.3 %  SOLN Place 1-2 drops into both eyes 3 (three) times daily as needed (dry/irritated eyes.).     polyethylene glycol-electrolytes (TRILYTE) 420 g solution Take 4,000 mLs by mouth as directed. 4000 mL 0   rosuvastatin (CRESTOR) 10 MG tablet Take 10 mg by mouth at bedtime.     Sod Picosulfate-Mag Ox-Cit Acd (CLENPIQ) 10-3.5-12 MG-GM -GM/175ML SOLN Take 1 kit by mouth as directed. 350 mL 0   TURMERIC CURCUMIN PO Take 3 capsules by mouth at bedtime.     levETIRAcetam (KEPPRA) 1000 MG tablet Take 1,000 mg by mouth 2 (two) times daily.     potassium chloride SA (KLOR-CON M) 20 MEQ tablet Take 2 tablets (40 mEq total) by mouth daily for 3 days. 6 tablet 0    No results found for this or any previous visit (from the past 48 hour(s)). No results found.  Review of Systems  All other systems reviewed and are negative.   Blood pressure 98/64, temperature 97.9 F (36.6 C), temperature source Oral, height 5\' 1"  (1.549 m), weight 80.3 kg, last menstrual period 03/06/2013, SpO2 99 %. Physical Exam  GENERAL: The patient is AO x3, in no acute distress. HEENT: Head is normocephalic and atraumatic. EOMI are intact. Mouth is well hydrated and without lesions. NECK: Supple. No masses LUNGS: Clear to auscultation. No presence of rhonchi/wheezing/rales. Adequate chest expansion HEART: RRR, normal s1 and s2. ABDOMEN: Soft, nontender, no guarding, no peritoneal signs, and nondistended. BS +. No masses. EXTREMITIES: Without any cyanosis, clubbing, rash, lesions or edema. NEUROLOGIC: AOx3, no focal motor deficit. SKIN: no jaundice, no rashes  Assessment/Plan [age] year old female with past medical history of hypertension, depression, osteoarthritis, seizures, ADHD, coming for family history of colon cancer no history of polyps.  Will proceed with colonoscopy.  Dolores Frameaniel Castaneda Mayorga, MD 02/17/2023, 10:04 AM

## 2023-02-17 NOTE — Transfer of Care (Signed)
Immediate Anesthesia Transfer of Care Note  Patient: Jasmine Potter  Procedure(s) Performed: COLONOSCOPY WITH PROPOFOL POLYPECTOMY  Patient Location: Short Stay  Anesthesia Type:General  Level of Consciousness: awake  Airway & Oxygen Therapy: Patient Spontanous Breathing  Post-op Assessment: Report given to RN  Post vital signs: Reviewed and stable  Last Vitals:  Vitals Value Taken Time  BP    Temp    Pulse    Resp    SpO2      Last Pain:  Vitals:   02/17/23 1021  TempSrc: Oral  PainSc: 0-No pain      Patients Stated Pain Goal: 6 (02/17/23 0953)  Complications: No notable events documented.

## 2023-02-17 NOTE — Op Note (Signed)
[facility] Patient Name: Jasmine Potter Procedure Date: 02/17/2023 9:34 AM MRN: [id number] Date of Birth: 03-May-1966 Attending MD: Katrinka Blazing , , [id number] CSN: [id number] Age: 57 Admit Type: Outpatient Procedure:                Colonoscopy Indications:              High risk colon cancer surveillance: Personal                            history of colonic polyps, Family history of colon                            cancer in a first-degree relative before age 44                            years Providers:                Katrinka Blazing, Sheran Fava, Burke Keels, Technician Referring MD:              Medicines:                Monitored Anesthesia Care Complications:            No immediate complications. Estimated Blood Loss:     Estimated blood loss: none. Procedure:                Pre-Anesthesia Assessment:                           - Prior to the procedure, a History and Physical                            was performed, and patient medications, allergies                            and sensitivities were reviewed. The patient's                            tolerance of previous anesthesia was reviewed.                           - The risks and benefits of the procedure and the                            sedation options and risks were discussed with the                            patient. All questions were answered and informed                            consent was obtained.                           - ASA Grade Assessment: III - A patient with severe  systemic disease.                           After obtaining informed consent, the colonoscope                            was passed under direct vision. Throughout the                            procedure, the patient's blood pressure, pulse, and                            oxygen saturations were monitored continuously. The                            [id number]-[id number]  ([id number]) scope was introduced through                            the anus and advanced to the the cecum, identified                            by appendiceal orifice and ileocecal valve. The                            colonoscopy was performed with difficulty due to                            inadequate bowel prep. The patient tolerated the                            procedure well. The quality of the bowel                            preparation was inadequate. Scope In: 10:09:57 AM Scope Out: 10:31:15 AM Scope Withdrawal Time: 0 hours 11 minutes 45 seconds  Total Procedure Duration: 0 hours 21 minutes 18 seconds  Findings:      The perianal and digital rectal examinations were normal.      A large amount of stool was found in the transverse colon, in the       ascending colon and in the cecum, precluding visualization.      A 3 mm polyp was found in the descending colon. The polyp was sessile.       The polyp was removed with a cold snare. Resection and retrieval were       complete.      A diffuse area of melanosis was found in the entire colon.      The retroflexed view of the distal rectum and anal verge was normal and       showed no anal or rectal abnormalities. Impression:               - Preparation of the colon was inadequate.                           - Stool in the transverse colon, in the ascending  colon and in the cecum.                           - One 3 mm polyp in the descending colon, removed                            with a cold snare. Resected and retrieved.                           - Melanosis in the colon.                           - The distal rectum and anal verge are normal on                            retroflexion view. Moderate Sedation:      Per Anesthesia Care Recommendation:           - Discharge patient to home (ambulatory).                           - Resume previous diet.                           - Await pathology  results.                           - Repeat colonoscopy at next available appointment                            (within 3 months) because the bowel preparation was                            poor. Will need a two day prep with Golytely or                            Trilyte. Procedure Code(s):        --- Professional ---                           [phone] [id number], Colonoscopy, flexible; with removal of                            tumor(s), polyp(s), or other lesion(s) by snare                            technique Diagnosis Code(s):        --- Professional ---                           Z86.010, Personal history of colonic polyps                           D12.4, Benign neoplasm of descending colon  K63.89, Other specified diseases of intestine                           Z80.0, Family history of malignant neoplasm of                            digestive organs CPT copyright 2022 American Medical Association. All rights reserved. The codes documented in this report are preliminary and upon coder review may  be revised to meet current compliance requirements. Katrinka Blazing, MD Katrinka Blazing,  02/17/2023 10:39:46 AM This report has been signed electronically. Number of Addenda: 0

## 2023-02-18 ENCOUNTER — Encounter (INDEPENDENT_AMBULATORY_CARE_PROVIDER_SITE_OTHER): Payer: Self-pay | Admitting: *Deleted

## 2023-02-18 LAB — SURGICAL PATHOLOGY

## 2023-02-20 NOTE — Progress Notes (Signed)
Correction, patient had history of colon polyps

## 2023-02-24 ENCOUNTER — Encounter (HOSPITAL_COMMUNITY): Payer: Self-pay | Admitting: Gastroenterology

## 2023-03-03 DIAGNOSIS — I10 Essential (primary) hypertension: Secondary | ICD-10-CM | POA: Diagnosis not present

## 2023-03-03 DIAGNOSIS — F909 Attention-deficit hyperactivity disorder, unspecified type: Secondary | ICD-10-CM | POA: Diagnosis not present

## 2023-03-03 DIAGNOSIS — M4316 Spondylolisthesis, lumbar region: Secondary | ICD-10-CM | POA: Diagnosis not present

## 2023-03-03 DIAGNOSIS — G894 Chronic pain syndrome: Secondary | ICD-10-CM | POA: Diagnosis not present

## 2023-04-10 ENCOUNTER — Encounter (INDEPENDENT_AMBULATORY_CARE_PROVIDER_SITE_OTHER): Payer: Self-pay | Admitting: *Deleted

## 2023-04-23 DIAGNOSIS — E782 Mixed hyperlipidemia: Secondary | ICD-10-CM | POA: Diagnosis not present

## 2023-04-23 DIAGNOSIS — G40309 Generalized idiopathic epilepsy and epileptic syndromes, not intractable, without status epilepticus: Secondary | ICD-10-CM | POA: Diagnosis not present

## 2023-04-23 DIAGNOSIS — G894 Chronic pain syndrome: Secondary | ICD-10-CM | POA: Diagnosis not present

## 2023-04-23 DIAGNOSIS — F909 Attention-deficit hyperactivity disorder, unspecified type: Secondary | ICD-10-CM | POA: Diagnosis not present

## 2023-05-05 ENCOUNTER — Telehealth (INDEPENDENT_AMBULATORY_CARE_PROVIDER_SITE_OTHER): Payer: Self-pay | Admitting: Gastroenterology

## 2023-05-05 NOTE — Telephone Encounter (Signed)
Room 1. 2 day prep please (needs to be Trilyte or Golytely, no other option) Thanks

## 2023-05-05 NOTE — Telephone Encounter (Signed)
Who is your primary care physician: Dr.Lawrence Fusco  Reasons for the colonoscopy: 3 month recall  Have you had a colonoscopy before?  Yes 01/2023  Do you have family history of colon cancer? Yes grandmother, brother  Previous colonoscopy with polyps removed? Yes 01/2023  Do you have a history colorectal cancer?   no  Are you diabetic? If yes, Type 1 or Type 2?    no  Do you have a prosthetic or mechanical heart valve? no  Do you have a pacemaker/defibrillator?   no  Have you had endocarditis/atrial fibrillation? no  Have you had joint replacement within the last 12 months?  no  Do you tend to be constipated or have to use laxatives? no  Do you have any history of drugs or alchohol?  no  Do you use supplemental oxygen?  no  Have you had a stroke or heart attack within the last 6 months?no  Do you take weight loss medication?  yes  For female patients: have you had a hysterectomy?  no                                     are you post menopausal?       no                                            do you still have your menstrual cycle? no      Do you take any blood-thinning medications such as: (aspirin, warfarin, Plavix, Aggrenox)  yes   If yes we need the name, milligram, dosage and who is prescribing doctor [employer]in Current Outpatient Medications on File Prior to Visit  Medication Sig Dispense Refill   ALPRAZolam (XANAX) 1 MG tablet Take 1 mg by mouth daily as needed for anxiety.     amphetamine-dextroamphetamine (ADDERALL) 20 MG tablet Take 20 mg by mouth daily at 6 (six) AM.     aspirin EC 81 MG tablet Take 81 mg by mouth at bedtime.     atenolol (TENORMIN) 50 MG tablet Take 50 mg by mouth in the morning.     CALCIUM PO Take 2 capsules by mouth in the morning. Living calcium advanced     chlorthalidone (HYGROTON) 25 MG tablet Take 25 mg by mouth in the morning.     Coenzyme Q10 (COQ10 PO) Take 1 capsule by mouth in the morning.     Cyanocobalamin (VITAMIN B-12  PO) Take 1 tablet by mouth in the morning.     diclofenac Sodium (VOLTAREN ARTHRITIS PAIN) 1 % GEL Apply 2 g topically 4 (four) times daily as needed (pain).     FLUoxetine (PROZAC) 20 MG capsule Take 60 mg by mouth in the morning.     Garlic (GARLIQUE PO) Take 1 capsule by mouth every evening.     GRAPE SEED EXTRACT PO Take 200 mg by mouth in the morning.     Omega-3 Fatty Acids (FISH OIL PO) Take 1 capsule by mouth in the morning and at bedtime.     OVER THE COUNTER MEDICATION Take 2 tablets by mouth in the morning. Reishi mushroom     oxyCODONE (ROXICODONE) 15 MG immediate release tablet Take 15 mg by mouth 2 (two) times daily as needed for pain.     Polyethyl  Glycol-Propyl Glycol (LUBRICANT EYE DROPS) 0.4-0.3 % SOLN Place 1-2 drops into both eyes 3 (three) times daily as needed (dry/irritated eyes.).     rosuvastatin (CRESTOR) 10 MG tablet Take 10 mg by mouth at bedtime.     TURMERIC CURCUMIN PO Take 3 capsules by mouth at bedtime.     potassium chloride SA (KLOR-CON M) 20 MEQ tablet Take 2 tablets (40 mEq total) by mouth daily for 3 days. 6 tablet 0   No current facility-administered medications on file prior to visit.    No Known Allergies   Pharmacy: [facility]  Primary Insurance Name: [facility]  Best number where you can be reached: [phone]

## 2023-05-06 NOTE — Telephone Encounter (Signed)
Left message to return call 

## 2023-05-08 NOTE — Telephone Encounter (Signed)
Left message to return call.  Will send letter.

## 2023-05-19 ENCOUNTER — Other Ambulatory Visit: Payer: Self-pay | Admitting: Gastroenterology

## 2023-05-19 NOTE — Telephone Encounter (Signed)
Pt contacted. Opening on 05/22/23 at 2:30pm but pt unable to that date. Advised pt that is all we have for July but I can get her schedule in August. Pt verbalized understanding. Will call pt when I have August schedule

## 2023-06-04 NOTE — Telephone Encounter (Signed)
Left message to return call 

## 2023-06-16 NOTE — Telephone Encounter (Signed)
Pt left message in regards to scheduling TCS. Pt requested midday after 06/27/23. Placed pt on schedule for 07/01/23 at 1:30pm. Left message to return call to verify 07/01/23 would be ok.

## 2023-06-18 MED ORDER — PEG 3350-KCL-NA BICARB-NACL 420 G PO SOLR: 4000.0000 mL | Freq: Once | ORAL | 0 refills | Status: AC

## 2023-06-18 NOTE — Telephone Encounter (Signed)
Pt left voicemail stating that she is needing to change date of TCS to the week after 07/01/23. Place pt on schedule for 07/09/23 at 1:30pm with [facility]. Sent in prep and mailed instructions to patient.

## 2023-06-18 NOTE — Addendum Note (Signed)
Addended by: Marlowe Shores on: 06/18/2023 12:09 PM   Modules accepted: Orders

## 2023-07-08 ENCOUNTER — Telehealth (INDEPENDENT_AMBULATORY_CARE_PROVIDER_SITE_OTHER): Payer: Self-pay | Admitting: Gastroenterology

## 2023-07-08 NOTE — Telephone Encounter (Signed)
Pt left voicemail yesterday wanting to cancel colonoscopy for 07/09/23 with Dr.Castaneda. Pt states she will call back to reschedule. From what I can see in her chart, this is the 3rd time she has cancelled.

## 2023-07-08 NOTE — Telephone Encounter (Signed)
Thanks, She came to her previous one.  Prior to rescheduling her, she will need to make an appointment in the clinic. Thanks

## 2023-07-09 ENCOUNTER — Ambulatory Visit (HOSPITAL_COMMUNITY): Admission: RE | Admit: 2023-07-09 | Payer: Medicaid Other | Source: Home / Self Care | Admitting: Gastroenterology

## 2023-07-09 ENCOUNTER — Encounter (HOSPITAL_COMMUNITY): Admission: RE | Payer: Self-pay | Source: Home / Self Care

## 2023-07-09 SURGERY — COLONOSCOPY WITH PROPOFOL
Anesthesia: Monitor Anesthesia Care

## 2023-07-09 NOTE — Telephone Encounter (Signed)
FYI

## 2023-08-27 ENCOUNTER — Ambulatory Visit: Payer: Medicaid Other | Attending: Internal Medicine | Admitting: Internal Medicine

## 2023-08-27 VITALS — BP 108/82 | HR 75 | Ht 61.0 in | Wt 177.0 lb

## 2023-08-27 DIAGNOSIS — I8393 Asymptomatic varicose veins of bilateral lower extremities: Secondary | ICD-10-CM | POA: Diagnosis not present

## 2023-08-27 NOTE — Patient Instructions (Signed)
Medication Instructions:  Your physician recommends that you continue on your current medications as directed. Please refer to the Current Medication list given to you today.  *If you need a refill on your cardiac medications before your next appointment, please call your pharmacy*   Lab Work: NONE   If you have labs (blood work) drawn today and your tests are completely normal, you will receive your results only by: MyChart Message (if you have MyChart) OR A paper copy in the mail If you have any lab test that is abnormal or we need to change your treatment, we will call you to review the results.   Testing/Procedures: NONE    Follow-Up: At [facility], you and your health needs are our priority.  As part of our continuing mission to provide you with exceptional heart care, we have created designated Provider Care Teams.  These Care Teams include your primary Cardiologist (physician) and Advanced Practice Providers (APPs -  Physician Assistants and Nurse Practitioners) who all work together to provide you with the care you need, when you need it.  We recommend signing up for the patient portal called "MyChart".  Sign up information is provided on this After Visit Summary.  MyChart is used to connect with patients for Virtual Visits (Telemedicine).  Patients are able to view lab/test results, encounter notes, upcoming appointments, etc.  Non-urgent messages can be sent to your provider as well.   To learn more about what you can do with MyChart, go to [email or web address].    Your next appointment:    As Needed   Provider:   You may see Dietrich Pates, MD or one of the following Advanced Practice Providers on your designated Care Team:   Randall An, PA-C  Jacolyn Reedy, PA-C     Other Instructions Thank you for choosing [facility]!

## 2023-08-27 NOTE — Progress Notes (Signed)
Cardiology Office Note   Date:  08/27/2023   ID:  Jasmine Potter, DOB November 21, 1965, MRN [id number]  PCP:  Elfredia Nevins, MD  Cardiologist:   Dietrich Pates, MD   Pt referred for venous insufficiency   History of Present Illness: Jasmine Potter is a 58 y.o. female with a history of HTN, subclinical hyperthyroidism and seizures  She is referred for LE edema due to venous insufficiency  She was seen by Ashley Royalty in March 2021   Felt to have signficant varicose veins   Did not think was a candidate for laser ablation at the time   Recomm support hose and elevation  Since seen the pt continues to complain of sweilling    She says she wears support hose and elevates her legs as possible   She has had no skin break down  She denies SOB   Is very active   Denies CP   No dizziness or syncope   No palpitaitons           Current Meds  Medication Sig   ALPRAZolam (XANAX) 1 MG tablet Take 1 mg by mouth daily as needed for anxiety.   amphetamine-dextroamphetamine (ADDERALL) 20 MG tablet Take 20 mg by mouth daily at 6 (six) AM.   aspirin EC 81 MG tablet Take 81 mg by mouth at bedtime.   atenolol (TENORMIN) 50 MG tablet Take 50 mg by mouth in the morning.   Coenzyme Q10 (COQ10 PO) Take 1 capsule by mouth in the morning.   Cyanocobalamin (VITAMIN B-12 PO) Take 1 tablet by mouth in the morning.   diclofenac Sodium (VOLTAREN ARTHRITIS PAIN) 1 % GEL Apply 2 g topically 4 (four) times daily as needed (pain).   FLUoxetine (PROZAC) 20 MG capsule Take 60 mg by mouth in the morning.   GRAPE SEED EXTRACT PO Take 200 mg by mouth in the morning.   Omega-3 Fatty Acids (FISH OIL PO) Take 1 capsule by mouth in the morning and at bedtime.   OVER THE COUNTER MEDICATION Take 2 tablets by mouth in the morning. Reishi mushroom   oxyCODONE (ROXICODONE) 15 MG immediate release tablet Take 15 mg by mouth 2 (two) times daily as needed for pain.   Polyethyl Glycol-Propyl Glycol (LUBRICANT EYE DROPS) 0.4-0.3 % SOLN Place  1-2 drops into both eyes 3 (three) times daily as needed (dry/irritated eyes.).   rosuvastatin (CRESTOR) 10 MG tablet Take 10 mg by mouth at bedtime.   TURMERIC CURCUMIN PO Take 3 capsules by mouth at bedtime.     Allergies:   Patient has no known allergies.   Past Medical History:  Diagnosis Date   ADD (attention deficit disorder)    Back pain    Chronic back pain    Depression    Hypertension    OA (osteoarthritis)    Pneumonia    Seizure (HCC)    followed up with nreuology...result of low potassium.Marland Kitchen no further work up needed   Spondylosis     Past Surgical History:  Procedure Laterality Date   APPENDECTOMY     CATARACT EXTRACTION W/ INTRAOCULAR LENS IMPLANT     left eye   CESAREAN SECTION  1991   CHOLECYSTECTOMY     COLONOSCOPY W/ BIOPSIES AND POLYPECTOMY     COLONOSCOPY WITH PROPOFOL N/A 02/17/2023   Procedure: COLONOSCOPY WITH PROPOFOL;  Surgeon: Dolores Frame, MD;  [facility];  Service: Gastroenterology;  Laterality: N/A;  1:30pm;ASA 3   HEMORRHOID SURGERY  INTRAOCULAR LENS REMOVAL Left 07/16/2017   Procedure: REMOVAL OF INTRAOCULAR LENS;  Surgeon: Carmela Rima, MD;  Location: [facility] OR;  Service: Ophthalmology;  Laterality: Left;   LASER PHOTO ABLATION Left 07/16/2017   Procedure: LASER PHOTO ABLATION;  Surgeon: Carmela Rima, MD;  [facility];  Service: Ophthalmology;  Laterality: Left;   PARS PLANA VITRECTOMY Left 07/16/2017   Procedure: PARS PLANA VITRECTOMY WITH 25 GAUGE WITH PLACEMENT OF INTRAOCULAR LENS;  Surgeon: Carmela Rima, MD;  Location: [facility] OR;  Service: Ophthalmology;  Laterality: Left;   POLYPECTOMY  02/17/2023   Procedure: POLYPECTOMY;  Surgeon: Dolores Frame, MD;  Location: [facility];  Service: Gastroenterology;;   TUBAL LIGATION       Social History:  The patient  reports that she quit smoking about 10 years ago. Her smoking use included cigarettes. She has never used smokeless tobacco. She reports  that she does not drink alcohol and does not use drugs.   Family History:  The patient's family history includes Breast cancer in her mother and sister; CAD in her father; Colon cancer in her brother and paternal grandmother; Heart attack in her father; Leukemia in her sister; Liver cancer in her brother; Seizures in her cousin; Spina bifida in her cousin.    ROS:  Please see the history of present illness. All other systems are reviewed and  Negative to the above problem except as noted.    PHYSICAL EXAM: VS:  BP 108/82 (BP Location: Right Arm)   Pulse 75   Ht 5\' 1"  (1.549 m)   Wt 177 lb (80.3 kg)   LMP 03/06/2013   SpO2 97%   BMI 33.44 kg/m   GEN: Well nourished, well developed, in no acute distress  HEENT: normal  Neck: JVP is normal  No bruits Cardiac: RRR; no murmur Respiratory:  clear to auscultatio GI: soft, nontender  No  hepatomegaly  Ext:  Prominent varicosities both legs   No signficant edema   Skin intact  EKG:  EKG is not ordered today.   LE venous USN  2021  Right:  - No evidence of deep vein thrombosis seen in the right lower extremity,  from the common femoral through the popliteal veins.  - No evidence of superficial venous thrombosis in the right lower  extremity.  - Deep vein reflux in the CFV.  - Superficial reflux in the SSV at the mid calf.  - Superficial reflux in the SFJ and throughout the GSV.     Left:  - No evidence of deep vein thrombosis seen in the left lower extremity,  from the common femoral through the popliteal veins.  - No evidence of superficial venous thrombosis in the left lower  extremity.  - Deep vein reflux in the CFV.  - Superficial reflux in the SFJ and the GSV a the knee.      Lipid Panel    Component Value Date/Time   CHOL 180 08/06/2021 0000   TRIG 47 08/06/2021 0000   HDL 75 (A) 08/06/2021 0000   LDLCALC 96 08/06/2021 0000      Wt Readings from Last 3 Encounters:  08/27/23 177 lb (80.3 kg)  02/17/23 177 lb  0.5 oz (80.3 kg)  02/13/23 177 lb 0.5 oz (80.3 kg)      ASSESSMENT AND PLAN:  1  Varicose veins   Pt has signficant varicosities   Study noted above from 2021    I would recomm referral back to [facility] for reevaluation  Continue supportive measures with support socks, elevation.   Stay active  2  HTN   Very good control on current regimen     No dizziness  Continue  3  Lipids   PT on statin Would recomm rechecking LDL      Was 131 in Jan 2024 Keep on statin    Current medicines are reviewed at length with the patient today.  The patient does not have concerns regarding medicines.  Signed, Dietrich Pates, MD  08/27/2023 4:14 PM    [facility] [address] [place], [place], [place]  [address] Phone: [phone]; Fax: [phone]

## 2023-08-28 ENCOUNTER — Encounter: Payer: Self-pay | Admitting: Internal Medicine

## 2023-09-02 ENCOUNTER — Telehealth: Payer: Self-pay | Admitting: *Deleted

## 2023-09-02 DIAGNOSIS — Z13220 Encounter for screening for lipoid disorders: Secondary | ICD-10-CM

## 2023-09-02 NOTE — Telephone Encounter (Signed)
Called pt to notify. No answer. Left msg to call back.

## 2023-09-02 NOTE — Telephone Encounter (Signed)
-----   Message from Dietrich Pates sent at 08/28/2023  8:34 PM EDT ----- On review of records while dictating, realize that pt should have lipomed drawn.

## 2023-09-03 NOTE — Telephone Encounter (Signed)
Called pt, no answer, left msg to call back.

## 2023-09-03 NOTE — Telephone Encounter (Signed)
-----   Message from Dietrich Pates sent at 08/28/2023  8:34 PM EDT ----- On review of records while dictating, realize that pt should have lipomed drawn.

## 2023-09-16 NOTE — Telephone Encounter (Signed)
Called pt, no answer, left msg to call back.

## 2023-09-16 NOTE — Addendum Note (Signed)
Addended by: Kerney Elbe on: 09/16/2023 01:45 PM   Modules accepted: Orders

## 2023-09-16 NOTE — Telephone Encounter (Signed)
Patient was returning call. Please advise ?

## 2023-09-16 NOTE — Telephone Encounter (Signed)
Spoke with pt and notified that lab work was needed. Pt states she will try to come tomorrow.

## 2023-09-21 ENCOUNTER — Encounter (INDEPENDENT_AMBULATORY_CARE_PROVIDER_SITE_OTHER): Payer: Self-pay

## 2023-09-21 ENCOUNTER — Ambulatory Visit (INDEPENDENT_AMBULATORY_CARE_PROVIDER_SITE_OTHER): Payer: 59 | Admitting: Gastroenterology

## 2023-10-13 ENCOUNTER — Other Ambulatory Visit: Payer: Self-pay | Admitting: *Deleted

## 2023-10-13 DIAGNOSIS — I8393 Asymptomatic varicose veins of bilateral lower extremities: Secondary | ICD-10-CM

## 2023-10-26 ENCOUNTER — Ambulatory Visit (HOSPITAL_COMMUNITY)
Admission: RE | Admit: 2023-10-26 | Discharge: 2023-10-26 | Disposition: A | Discharge status: Home / Self Care | Payer: Medicaid Other | Source: Ambulatory Visit | Attending: Surgery | Admitting: Surgery

## 2023-10-26 ENCOUNTER — Ambulatory Visit (INDEPENDENT_AMBULATORY_CARE_PROVIDER_SITE_OTHER): Payer: Medicaid Other | Admitting: Physician Assistant

## 2023-10-26 VITALS — BP 109/73 | HR 75 | Temp 98.1°F | Resp 18 | Ht 61.0 in | Wt 172.2 lb

## 2023-10-26 DIAGNOSIS — I8393 Asymptomatic varicose veins of bilateral lower extremities: Secondary | ICD-10-CM

## 2023-10-26 DIAGNOSIS — I872 Venous insufficiency (chronic) (peripheral): Secondary | ICD-10-CM

## 2023-10-26 NOTE — Progress Notes (Signed)
Requested by:  Elfredia Nevins, MD [address] [place],  [place] [address]  Reason for consultation: BLE varicose veins    History of Present Illness   Jasmine Potter is a 57 y.o. (December 10, 1965) female who presents for evaluation of bilateral lower extremity varicose veins and swelling. She was seen back in 2021 by Dr. Edilia Bo for evaluation of her bilateral lower extremity painful varicose veins. She reports over the last couple years that they have become even more bothersome. She is had varicose veins in both legs > 15 years. She describes significant aching pain, heaviness and throbbing in both legs and also burning sometimes. Her symptoms are aggravated by standing or prolonged ambulation. 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 She said she does have a wedge and was good about elevating her legs daily for about 1 year but she has fallen out of routine. She wears pantyhose style compression stockings almost daily, but does not think that these help significantly. She does admit that she thinks they are too big as they move around quite a bit on her legs. She has family history of varicose veins in her sister and mother, although she says her mother died at a 21. No history of DVT.   Venous symptoms include:  Onset/duration:  > 15 years  Occupation:  CNA Aggravating factors: (sitting, standing) Alleviating factors: (elevation) Compression:  yes, panty hose Helps:  yes Pain medications:  no Previous vein procedures:  no History of DVT:  no  Past Medical History:  Diagnosis Date   ADD (attention deficit disorder)    Back pain    Chronic back pain    Depression    Hypertension    OA (osteoarthritis)    Pneumonia    Seizure (HCC)    followed up with nreuology...result of low potassium.Marland Kitchen no further work up needed   Spondylosis     Past Surgical History:  Procedure Laterality Date   APPENDECTOMY     CATARACT  EXTRACTION W/ INTRAOCULAR LENS IMPLANT     left eye   CESAREAN SECTION  1991   CHOLECYSTECTOMY     COLONOSCOPY W/ BIOPSIES AND POLYPECTOMY     COLONOSCOPY WITH PROPOFOL N/A 02/17/2023   Procedure: COLONOSCOPY WITH PROPOFOL;  Surgeon: Dolores Frame, MD;  [facility];  Service: Gastroenterology;  Laterality: N/A;  1:30pm;ASA 3   HEMORRHOID SURGERY     INTRAOCULAR LENS REMOVAL Left 07/16/2017   Procedure: REMOVAL OF INTRAOCULAR LENS;  Surgeon: Carmela Rima, MD;  Location: [facility] OR;  Service: Ophthalmology;  Laterality: Left;   LASER PHOTO ABLATION Left 07/16/2017   Procedure: LASER PHOTO ABLATION;  Surgeon: Carmela Rima, MD;  Location: [facility] OR;  Service: Ophthalmology;  Laterality: Left;   PARS PLANA VITRECTOMY Left 07/16/2017   Procedure: PARS PLANA VITRECTOMY WITH 25 GAUGE WITH PLACEMENT OF INTRAOCULAR LENS;  Surgeon: Carmela Rima, MD;  Location: [facility] OR;  Service: Ophthalmology;  Laterality: Left;   POLYPECTOMY  02/17/2023   Procedure: POLYPECTOMY;  Surgeon: Dolores Frame, MD;  [facility];  Service: Gastroenterology;;   TUBAL LIGATION      Social History   Socioeconomic History   Marital status: Divorced    Spouse name: Not on file   Number of children: Not on file   Years of education: Not on file   Highest education level: Not on file  Occupational History  Not on file  Tobacco Use   Smoking status: Former    Current packs/day: 0.00    Types: Cigarettes    Quit date: 2014    Years since quitting: 10.9   Smokeless tobacco: Never   Tobacco comments:    quit smoking cigarettes in 2014  Vaping Use   Vaping status: Every Day   Substances: Nicotine  Substance and Sexual Activity   Alcohol use: No   Drug use: No   Sexual activity: Not Currently    Birth control/protection: Surgical, Post-menopausal    Comment: tubal  Other Topics Concern   Not on file  Social History Narrative   ** Merged History Encounter **       Social  Drivers of Health   Financial Resource Strain: Low Risk  (09/12/2021)   Overall Financial Resource Strain (CARDIA)    Difficulty of Paying Living Expenses: Not hard at all  Food Insecurity: No Food Insecurity (09/12/2021)   Hunger Vital Sign    Worried About Running Out of Food in the Last Year: Never true    Ran Out of Food in the Last Year: Never true  Transportation Needs: No Transportation Needs (09/12/2021)   PRAPARE - Administrator, Civil Service (Medical): No    Lack of Transportation (Non-Medical): No  Physical Activity: Insufficiently Active (09/12/2021)   Exercise Vital Sign    Days of Exercise per Week: 2 days    Minutes of Exercise per Session: 20 min  Stress: No Stress Concern Present (09/12/2021)   [employer] of Occupational Health - Occupational Stress Questionnaire    Feeling of Stress : Not at all  Social Connections: Unknown (11/16/2022)   Received from [facility]   Social Network    Social Network: Not on file  Intimate Partner Violence: Unknown (11/16/2022)   Received from [facility], [facility]   HITS    Physically Hurt: Not on file    Insult or Talk Down To: Not on file    Threaten Physical Harm: Not on file    Scream or Curse: Not on file    Family History  Problem Relation Age of Onset   Breast cancer Mother    Heart attack Father    CAD Father    Leukemia Sister    Breast cancer Sister    Colon cancer Brother    Liver cancer Brother    Colon cancer Paternal Grandmother    Seizures Cousin    Spina bifida Cousin     Current Outpatient Medications  Medication Sig Dispense Refill   ALPRAZolam (XANAX) 1 MG tablet Take 1 mg by mouth daily as needed for anxiety.     amphetamine-dextroamphetamine (ADDERALL) 20 MG tablet Take 20 mg by mouth daily at 6 (six) AM.     aspirin EC 81 MG tablet Take 81 mg by mouth at bedtime.     atenolol (TENORMIN) 50 MG tablet Take 50 mg by mouth in the morning.     CALCIUM PO Take 2  capsules by mouth in the morning. Living calcium advanced     chlorthalidone (HYGROTON) 25 MG tablet Take 25 mg by mouth in the morning.     Coenzyme Q10 (COQ10 PO) Take 1 capsule by mouth in the morning.     Cyanocobalamin (VITAMIN B-12 PO) Take 1 tablet by mouth in the morning.     diclofenac Sodium (VOLTAREN ARTHRITIS PAIN) 1 % GEL Apply 2 g topically 4 (four) times daily as  needed (pain).     FLUoxetine (PROZAC) 20 MG capsule Take 60 mg by mouth in the morning.     Garlic (GARLIQUE PO) Take 1 capsule by mouth every evening.     Omega-3 Fatty Acids (FISH OIL PO) Take 1 capsule by mouth in the morning and at bedtime.     OVER THE COUNTER MEDICATION Take 2 tablets by mouth in the morning. Reishi mushroom     oxyCODONE (ROXICODONE) 15 MG immediate release tablet Take 15 mg by mouth 2 (two) times daily as needed for pain.     Polyethyl Glycol-Propyl Glycol (LUBRICANT EYE DROPS) 0.4-0.3 % SOLN Place 1-2 drops into both eyes 3 (three) times daily as needed (dry/irritated eyes.).     TURMERIC CURCUMIN PO Take 3 capsules by mouth at bedtime.     GRAPE SEED EXTRACT PO Take 200 mg by mouth in the morning.     potassium chloride SA (KLOR-CON M) 20 MEQ tablet Take 2 tablets (40 mEq total) by mouth daily for 3 days. (Patient not taking: Reported on 08/27/2023) 6 tablet 0   rosuvastatin (CRESTOR) 10 MG tablet Take 10 mg by mouth at bedtime.     No current facility-administered medications for this visit.    No Known Allergies  REVIEW OF SYSTEMS (negative unless checked):   Cardiac:  []  Chest pain or chest pressure? []  Shortness of breath upon activity? []  Shortness of breath when lying flat? []  Irregular heart rhythm?  Vascular:  []  Pain in calf, thigh, or hip brought on by walking? []  Pain in feet at night that wakes you up from your sleep? []  Blood clot in your veins? [x]  Leg swelling?  Pulmonary:  []  Oxygen at home? []  Productive cough? []  Wheezing?  Neurologic:  []  Sudden weakness  in arms or legs? []  Sudden numbness in arms or legs? []  Sudden onset of difficult speaking or slurred speech? []  Temporary loss of vision in one eye? []  Problems with dizziness?  Gastrointestinal:  []  Blood in stool? []  Vomited blood?  Genitourinary:  []  Burning when urinating? []  Blood in urine?  Psychiatric:  []  Major depression  Hematologic:  []  Bleeding problems? []  Problems with blood clotting?  Dermatologic:  []  Rashes or ulcers?  Constitutional:  []  Fever or chills?  Ear/Nose/Throat:  []  Change in hearing? []  Nose bleeds? []  Sore throat?  Musculoskeletal:  []  Back pain? []  Joint pain? []  Muscle pain?   Physical Examination     Vitals:   10/26/23 1253  BP: 109/73  Pulse: 75  Resp: 18  Temp: 98.1 F (36.7 C)  TempSrc: Temporal  SpO2: 95%  Weight: 172 lb 3.2 oz (78.1 kg)  Height: 5\' 1"  (1.549 m)   Body mass index is 32.54 kg/m.  General:  WDWN in NAD; vital signs documented above Gait: Normal HENT: WNL, normocephalic Pulmonary: normal non-labored breathing , without wheezing Cardiac: regular HR Abdomen: soft Vascular Exam/Pulses: 2+ Dp and PT pulses bilaterally, feet warm and well perfused Extremities: with varicose veins in bilateral lower legs as shown below, with reticular veins bilaterally, without edema, without stasis pigmentation, without lipodermatosclerosis, without ulcers         Musculoskeletal: no muscle wasting or atrophy  Neurologic: A&O X 3;  No focal weakness or paresthesias are detected Psychiatric:  The pt has Normal affect.  Non-invasive Vascular Imaging   BLE Venous Insufficiency Duplex (10/26/23):  RLE:  No DVT and SVT GSV reflux SFJ to proximal calf GSV diameter 0.24-0.41 cm No SSV reflux  No deep venous reflux   Medical Decision Making   WHITLEY DALPIAZ is a 57 y.o. female who presents with: RLE chronic venous insufficiency with varicose veins with pain. She has had progressive pain and increasing  varicose veins in BLE. She has been wearing panty hose compression stockings for several years now. I have encouraged her to transition to some of the thigh high compression. She also was previously elevating but has fallen out of habit recently. I discussed with her the importance of this and also showed her proper form. Her duplex today shows no DVT or SVT. She has GSV reflux throughout. She does not have any SSV or deep reflux.  Her GSV is borderline adequate size to be considered for ablation. It also is out of the fascia. I discussed with her my concerns with this regarding ablation. I do think she could be considered for ablation or at least stab phlebectomy of her large painful varicose veins. I also discussed option for sclerotherapy as well but she would like to hold off on this at this time.  Based on the patient's history and examination, I recommend: daily elevation of 20-30 minutes above level of heart, daily compression stocking use, exercise, weight reduction, refraining from prolonged sitting or standing I discussed with the patient the use of her 20-30 mm thigh high compression stockings and need for 3 month trial of such. She was measured and purchased a pair at today's visit. She has symptoms in her LLE as well so when she returns for her follow up I have ordered a LLE reflux study as well to see if she would possibly be a candidate for ablation on her left lower extremity as well The patient will follow up in 3 months with Dr. Randie Heinz or Dr. Metro Kung, PA-C [facility] Office: [phone]  10/26/2023, 1:29 PM  On call MD: Karin Lieu

## 2023-10-29 ENCOUNTER — Other Ambulatory Visit: Payer: Self-pay

## 2023-10-29 DIAGNOSIS — I872 Venous insufficiency (chronic) (peripheral): Secondary | ICD-10-CM

## 2023-11-24 ENCOUNTER — Other Ambulatory Visit (HOSPITAL_COMMUNITY): Payer: Self-pay | Admitting: Internal Medicine

## 2023-11-24 DIAGNOSIS — Z1231 Encounter for screening mammogram for malignant neoplasm of breast: Secondary | ICD-10-CM

## 2023-12-21 ENCOUNTER — Ambulatory Visit (HOSPITAL_COMMUNITY)
Admission: RE | Admit: 2023-12-21 | Discharge: 2023-12-21 | Disposition: A | Discharge status: Home / Self Care | Payer: Medicaid Other | Source: Ambulatory Visit | Attending: Internal Medicine | Admitting: Internal Medicine

## 2023-12-21 ENCOUNTER — Encounter (HOSPITAL_COMMUNITY): Payer: Self-pay

## 2023-12-21 DIAGNOSIS — Z1231 Encounter for screening mammogram for malignant neoplasm of breast: Secondary | ICD-10-CM | POA: Insufficient documentation

## 2024-01-21 ENCOUNTER — Ambulatory Visit: Payer: Medicaid Other | Admitting: General Surgery

## 2024-01-22 ENCOUNTER — Other Ambulatory Visit: Payer: Self-pay | Admitting: *Deleted

## 2024-01-22 DIAGNOSIS — Z1211 Encounter for screening for malignant neoplasm of colon: Secondary | ICD-10-CM

## 2024-01-26 ENCOUNTER — Ambulatory Visit (INDEPENDENT_AMBULATORY_CARE_PROVIDER_SITE_OTHER): Payer: Medicaid Other | Admitting: General Surgery

## 2024-01-26 ENCOUNTER — Encounter: Payer: Self-pay | Admitting: General Surgery

## 2024-01-26 VITALS — BP 131/86 | HR 67 | Temp 98.0°F | Resp 14 | Ht 61.0 in | Wt 170.0 lb

## 2024-01-26 DIAGNOSIS — Z860100 Personal history of colon polyps, unspecified: Secondary | ICD-10-CM

## 2024-01-26 MED ORDER — SUTAB 1479-225-188 MG PO TABS: 24.0000 | ORAL_TABLET | Freq: Once | ORAL | 0 refills | Status: AC

## 2024-01-26 NOTE — Patient Instructions (Signed)
 Be on clear liquid diet for two days prior to the procedure

## 2024-01-27 NOTE — Progress Notes (Signed)
 Jasmine Potter; [id number]; Apr 12, 1966   HPI Patient is a 58 year old white female who was referred to my care by [name] for evaluation and treatment of a history of colon polyps.  Patient has had multiple colonoscopies in the past for colon polyps.  She presents for a follow-up colonoscopy.  She last had a colonoscopy 1 year ago.  At that time, the bowel preparation was less than adequate and the specimen removed was not polypoid in nature.  She denies any blood per rectum, abnormal abdominal pain, diarrhea, or constipation.  She missed her last appointment with GI and now presents here for scheduling of a colonoscopy.  Her bowel movements are every other day.  They are regular in nature.  No change in caliber has been noted. Past Medical History:  Diagnosis Date   ADD (attention deficit disorder)    Back pain    Chronic back pain    Depression    Hypertension    OA (osteoarthritis)    Pneumonia    Seizure (HCC)    followed up with nreuology...result of low potassium.Jasmine Potter no further work up needed   Spondylosis     Past Surgical History:  Procedure Laterality Date   APPENDECTOMY     CATARACT EXTRACTION W/ INTRAOCULAR LENS IMPLANT     left eye   CESAREAN SECTION  1991   CHOLECYSTECTOMY     COLONOSCOPY W/ BIOPSIES AND POLYPECTOMY     COLONOSCOPY WITH PROPOFOL N/A 02/17/2023   Procedure: COLONOSCOPY WITH PROPOFOL;  Surgeon: Dolores Frame, MD;  [facility];  Service: Gastroenterology;  Laterality: N/A;  1:30pm;ASA 3   HEMORRHOID SURGERY     INTRAOCULAR LENS REMOVAL Left 07/16/2017   Procedure: REMOVAL OF INTRAOCULAR LENS;  Surgeon: Carmela Rima, MD;  Location: [facility] OR;  Service: Ophthalmology;  Laterality: Left;   LASER PHOTO ABLATION Left 07/16/2017   Procedure: LASER PHOTO ABLATION;  Surgeon: Carmela Rima, MD;  Location: [facility] OR;  Service: Ophthalmology;  Laterality: Left;   PARS PLANA VITRECTOMY Left 07/16/2017   Procedure: PARS PLANA VITRECTOMY WITH 25 GAUGE  WITH PLACEMENT OF INTRAOCULAR LENS;  Surgeon: Carmela Rima, MD;  Location: [facility] OR;  Service: Ophthalmology;  Laterality: Left;   POLYPECTOMY  02/17/2023   Procedure: POLYPECTOMY;  Surgeon: Dolores Frame, MD;  [facility];  Service: Gastroenterology;;   TUBAL LIGATION      Family History  Problem Relation Age of Onset   Breast cancer Mother    Heart attack Father    CAD Father    Leukemia Sister    Breast cancer Sister    Colon cancer Brother    Liver cancer Brother    Colon cancer Paternal Grandmother    Seizures Cousin    Spina bifida Cousin     Current Outpatient Medications on File Prior to Visit  Medication Sig Dispense Refill   ALPRAZolam (XANAX) 1 MG tablet Take 1 mg by mouth daily as needed for anxiety.     amphetamine-dextroamphetamine (ADDERALL) 20 MG tablet Take 20 mg by mouth daily at 6 (six) AM.     aspirin EC 81 MG tablet Take 81 mg by mouth at bedtime.     atenolol (TENORMIN) 50 MG tablet Take 50 mg by mouth in the morning.     chlorthalidone (HYGROTON) 25 MG tablet Take 25 mg by mouth in the morning.     Coenzyme Q10 (COQ10 PO) Take 1 capsule by mouth in the morning.     Cyanocobalamin (  VITAMIN B-12 PO) Take 1 tablet by mouth in the morning.     diclofenac Sodium (VOLTAREN ARTHRITIS PAIN) 1 % GEL Apply 2 g topically 4 (four) times daily as needed (pain).     FLUoxetine (PROZAC) 20 MG capsule Take 60 mg by mouth in the morning.     Garlic (GARLIQUE PO) Take 1 capsule by mouth every evening.     Omega-3 Fatty Acids (FISH OIL PO) Take 1 capsule by mouth in the morning and at bedtime.     OVER THE COUNTER MEDICATION Take 2 tablets by mouth in the morning. Reishi mushroom     oxyCODONE (ROXICODONE) 15 MG immediate release tablet Take 15 mg by mouth 2 (two) times daily as needed for pain.     Polyethyl Glycol-Propyl Glycol (LUBRICANT EYE DROPS) 0.4-0.3 % SOLN Place 1-2 drops into both eyes 3 (three) times daily as needed (dry/irritated eyes.).      potassium chloride SA (KLOR-CON M) 20 MEQ tablet Take 2 tablets (40 mEq total) by mouth daily for 3 days. 6 tablet 0   Semaglutide,0.25 or 0.5MG /DOS, (OZEMPIC, 0.25 OR 0.5 MG/DOSE,) 2 MG/1.5ML SOPN Inject into the skin.     TURMERIC CURCUMIN PO Take 3 capsules by mouth at bedtime.     No current facility-administered medications on file prior to visit.    No Known Allergies  Social History   Substance and Sexual Activity  Alcohol Use No    Social History   Tobacco Use  Smoking Status Former   Current packs/day: 0.00   Types: Cigarettes   Quit date: 2014   Years since quitting: 11.2  Smokeless Tobacco Never  Tobacco Comments   quit smoking cigarettes in 2014    Review of Systems  Constitutional: Negative.   HENT: Negative.    Eyes:  Positive for pain.  Respiratory: Negative.    Cardiovascular: Negative.   Gastrointestinal: Negative.   Genitourinary: Negative.   Musculoskeletal:  Positive for back pain and joint pain.  Skin: Negative.   Neurological: Negative.   Endo/Heme/Allergies: Negative.   Psychiatric/Behavioral: Negative.      Objective   Vitals:   01/26/24 1450  BP: 131/86  Pulse: 67  Resp: 14  Temp: 98 F (36.7 C)  SpO2: 98%    Physical Exam Vitals reviewed.  Constitutional:      Appearance: Normal appearance. She is not ill-appearing.  HENT:     Head: Normocephalic and atraumatic.  Cardiovascular:     Rate and Rhythm: Normal rate and regular rhythm.     Heart sounds: Normal heart sounds. No murmur heard.    No friction rub. No gallop.  Pulmonary:     Effort: Pulmonary effort is normal. No respiratory distress.     Breath sounds: Normal breath sounds. No stridor. No wheezing, rhonchi or rales.  Abdominal:     General: Bowel sounds are normal. There is no distension.     Palpations: Abdomen is soft. There is no mass.     Tenderness: There is no abdominal tenderness. There is no guarding or rebound.     Hernia: No hernia is present.   Skin:    General: Skin is warm and dry.  Neurological:     Mental Status: She is alert and oriented to person, place, and time.   Previous colonoscopy note reviewed  Assessment  History of colon polyps Plan  Patient is scheduled for a colonoscopy on 02/09/2024.  The risks and benefits of the procedure including bleeding and perforation were  fully explained to the patient, who gave informed consent.  She will be on clear liquid diet for 2 days prior to the procedure.  Sutabs have been prescribed for bowel preparation.

## 2024-01-28 NOTE — H&P (Signed)
 Jasmine Potter; [id number]; Apr 12, 1966   HPI Patient is a 58 year old white female who was referred to my care by [name] for evaluation and treatment of a history of colon polyps.  Patient has had multiple colonoscopies in the past for colon polyps.  She presents for a follow-up colonoscopy.  She last had a colonoscopy 1 year ago.  At that time, the bowel preparation was less than adequate and the specimen removed was not polypoid in nature.  She denies any blood per rectum, abnormal abdominal pain, diarrhea, or constipation.  She missed her last appointment with GI and now presents here for scheduling of a colonoscopy.  Her bowel movements are every other day.  They are regular in nature.  No change in caliber has been noted. Past Medical History:  Diagnosis Date   ADD (attention deficit disorder)    Back pain    Chronic back pain    Depression    Hypertension    OA (osteoarthritis)    Pneumonia    Seizure (HCC)    followed up with nreuology...result of low potassium.Marland Kitchen no further work up needed   Spondylosis     Past Surgical History:  Procedure Laterality Date   APPENDECTOMY     CATARACT EXTRACTION W/ INTRAOCULAR LENS IMPLANT     left eye   CESAREAN SECTION  1991   CHOLECYSTECTOMY     COLONOSCOPY W/ BIOPSIES AND POLYPECTOMY     COLONOSCOPY WITH PROPOFOL N/A 02/17/2023   Procedure: COLONOSCOPY WITH PROPOFOL;  Surgeon: Dolores Frame, MD;  Location: [facility];  Service: Gastroenterology;  Laterality: N/A;  1:30pm;ASA 3   HEMORRHOID SURGERY     INTRAOCULAR LENS REMOVAL Left 07/16/2017   Procedure: REMOVAL OF INTRAOCULAR LENS;  Surgeon: Carmela Rima, MD;  Location: [facility] OR;  Service: Ophthalmology;  Laterality: Left;   LASER PHOTO ABLATION Left 07/16/2017   Procedure: LASER PHOTO ABLATION;  Surgeon: Carmela Rima, MD;  Location: [facility] OR;  Service: Ophthalmology;  Laterality: Left;   PARS PLANA VITRECTOMY Left 07/16/2017   Procedure: PARS PLANA VITRECTOMY WITH 25 GAUGE  WITH PLACEMENT OF INTRAOCULAR LENS;  Surgeon: Carmela Rima, MD;  Location: [facility] OR;  Service: Ophthalmology;  Laterality: Left;   POLYPECTOMY  02/17/2023   Procedure: POLYPECTOMY;  Surgeon: Dolores Frame, MD;  Location: [facility];  Service: Gastroenterology;;   TUBAL LIGATION      Family History  Problem Relation Age of Onset   Breast cancer Mother    Heart attack Father    CAD Father    Leukemia Sister    Breast cancer Sister    Colon cancer Brother    Liver cancer Brother    Colon cancer Paternal Grandmother    Seizures Cousin    Spina bifida Cousin     Current Outpatient Medications on File Prior to Visit  Medication Sig Dispense Refill   ALPRAZolam (XANAX) 1 MG tablet Take 1 mg by mouth daily as needed for anxiety.     amphetamine-dextroamphetamine (ADDERALL) 20 MG tablet Take 20 mg by mouth daily at 6 (six) AM.     aspirin EC 81 MG tablet Take 81 mg by mouth at bedtime.     atenolol (TENORMIN) 50 MG tablet Take 50 mg by mouth in the morning.     chlorthalidone (HYGROTON) 25 MG tablet Take 25 mg by mouth in the morning.     Coenzyme Q10 (COQ10 PO) Take 1 capsule by mouth in the morning.     Cyanocobalamin (  VITAMIN B-12 PO) Take 1 tablet by mouth in the morning.     diclofenac Sodium (VOLTAREN ARTHRITIS PAIN) 1 % GEL Apply 2 g topically 4 (four) times daily as needed (pain).     FLUoxetine (PROZAC) 20 MG capsule Take 60 mg by mouth in the morning.     Garlic (GARLIQUE PO) Take 1 capsule by mouth every evening.     Omega-3 Fatty Acids (FISH OIL PO) Take 1 capsule by mouth in the morning and at bedtime.     OVER THE COUNTER MEDICATION Take 2 tablets by mouth in the morning. Reishi mushroom     oxyCODONE (ROXICODONE) 15 MG immediate release tablet Take 15 mg by mouth 2 (two) times daily as needed for pain.     Polyethyl Glycol-Propyl Glycol (LUBRICANT EYE DROPS) 0.4-0.3 % SOLN Place 1-2 drops into both eyes 3 (three) times daily as needed (dry/irritated eyes.).      potassium chloride SA (KLOR-CON M) 20 MEQ tablet Take 2 tablets (40 mEq total) by mouth daily for 3 days. 6 tablet 0   Semaglutide,0.25 or 0.5MG /DOS, (OZEMPIC, 0.25 OR 0.5 MG/DOSE,) 2 MG/1.5ML SOPN Inject into the skin.     TURMERIC CURCUMIN PO Take 3 capsules by mouth at bedtime.     No current facility-administered medications on file prior to visit.    No Known Allergies  Social History   Substance and Sexual Activity  Alcohol Use No    Social History   Tobacco Use  Smoking Status Former   Current packs/day: 0.00   Types: Cigarettes   Quit date: 2014   Years since quitting: 11.2  Smokeless Tobacco Never  Tobacco Comments   quit smoking cigarettes in 2014    Review of Systems  Constitutional: Negative.   HENT: Negative.    Eyes:  Positive for pain.  Respiratory: Negative.    Cardiovascular: Negative.   Gastrointestinal: Negative.   Genitourinary: Negative.   Musculoskeletal:  Positive for back pain and joint pain.  Skin: Negative.   Neurological: Negative.   Endo/Heme/Allergies: Negative.   Psychiatric/Behavioral: Negative.      Objective   Vitals:   01/26/24 1450  BP: 131/86  Pulse: 67  Resp: 14  Temp: 98 F (36.7 C)  SpO2: 98%    Physical Exam Vitals reviewed.  Constitutional:      Appearance: Normal appearance. She is not ill-appearing.  HENT:     Head: Normocephalic and atraumatic.  Cardiovascular:     Rate and Rhythm: Normal rate and regular rhythm.     Heart sounds: Normal heart sounds. No murmur heard.    No friction rub. No gallop.  Pulmonary:     Effort: Pulmonary effort is normal. No respiratory distress.     Breath sounds: Normal breath sounds. No stridor. No wheezing, rhonchi or rales.  Abdominal:     General: Bowel sounds are normal. There is no distension.     Palpations: Abdomen is soft. There is no mass.     Tenderness: There is no abdominal tenderness. There is no guarding or rebound.     Hernia: No hernia is present.   Skin:    General: Skin is warm and dry.  Neurological:     Mental Status: She is alert and oriented to person, place, and time.   Previous colonoscopy note reviewed  Assessment  History of colon polyps Plan  Patient is scheduled for a colonoscopy on 02/09/2024.  The risks and benefits of the procedure including bleeding and perforation were  fully explained to the patient, who gave informed consent.  She will be on clear liquid diet for 2 days prior to the procedure.  Sutabs have been prescribed for bowel preparation.

## 2024-02-08 NOTE — Anesthesia Preprocedure Evaluation (Signed)
 Anesthesia Evaluation  Patient identified by MRN, date of birth, ID band Patient awake    Reviewed: Allergy & Precautions, H&P , NPO status , Patient's Chart, lab work & pertinent test results, reviewed documented beta blocker date and time   Airway Mallampati: II  TM Distance: >3 FB Neck ROM: full    Dental no notable dental hx. (+) Dental Advisory Given, Teeth Intact   Pulmonary pneumonia, former smoker   Pulmonary exam normal breath sounds clear to auscultation       Cardiovascular Exercise Tolerance: Good hypertension, Normal cardiovascular exam Rhythm:regular Rate:Normal     Neuro/Psych Seizures -,  PSYCHIATRIC DISORDERS  Depression     negative psych ROS   GI/Hepatic negative GI ROS, Neg liver ROS,,,  Endo/Other   Hyperthyroidism   Renal/GU negative Renal ROS  negative genitourinary   Musculoskeletal  (+) Arthritis , Osteoarthritis,    Abdominal   Peds  Hematology negative hematology ROS (+)   Anesthesia Other Findings   Reproductive/Obstetrics negative OB ROS                             Anesthesia Physical Anesthesia Plan  ASA: 2  Anesthesia Plan: General   Post-op Pain Management: Minimal or no pain anticipated   Induction: Intravenous  PONV Risk Score and Plan: Propofol infusion  Airway Management Planned: Nasal Cannula and Natural Airway  Additional Equipment: None  Intra-op Plan:   Post-operative Plan:   Informed Consent: I have reviewed the patients History and Physical, chart, labs and discussed the procedure including the risks, benefits and alternatives for the proposed anesthesia with the patient or authorized representative who has indicated his/her understanding and acceptance.     Dental Advisory Given  Plan Discussed with: CRNA  Anesthesia Plan Comments:        Anesthesia Quick Evaluation

## 2024-02-09 ENCOUNTER — Ambulatory Visit (HOSPITAL_BASED_OUTPATIENT_CLINIC_OR_DEPARTMENT_OTHER): Payer: Self-pay | Admitting: Anesthesiology

## 2024-02-09 ENCOUNTER — Encounter (HOSPITAL_COMMUNITY)
Admission: RE | Disposition: A | Discharge status: Home / Self Care | Payer: Self-pay | Source: Home / Self Care | Attending: General Surgery

## 2024-02-09 ENCOUNTER — Encounter (HOSPITAL_COMMUNITY): Payer: Self-pay | Admitting: General Surgery

## 2024-02-09 ENCOUNTER — Ambulatory Visit (HOSPITAL_COMMUNITY)
Admission: RE | Admit: 2024-02-09 | Discharge: 2024-02-09 | Disposition: A | Discharge status: Home / Self Care | Attending: General Surgery | Admitting: General Surgery

## 2024-02-09 ENCOUNTER — Ambulatory Visit (HOSPITAL_COMMUNITY): Payer: Self-pay | Admitting: Anesthesiology

## 2024-02-09 ENCOUNTER — Other Ambulatory Visit: Payer: Self-pay

## 2024-02-09 DIAGNOSIS — Z7982 Long term (current) use of aspirin: Secondary | ICD-10-CM | POA: Diagnosis not present

## 2024-02-09 DIAGNOSIS — Z79899 Other long term (current) drug therapy: Secondary | ICD-10-CM | POA: Diagnosis not present

## 2024-02-09 DIAGNOSIS — Z860100 Personal history of colon polyps, unspecified: Secondary | ICD-10-CM

## 2024-02-09 DIAGNOSIS — M1990 Unspecified osteoarthritis, unspecified site: Secondary | ICD-10-CM | POA: Diagnosis not present

## 2024-02-09 DIAGNOSIS — R569 Unspecified convulsions: Secondary | ICD-10-CM | POA: Diagnosis not present

## 2024-02-09 DIAGNOSIS — F32A Depression, unspecified: Secondary | ICD-10-CM | POA: Diagnosis not present

## 2024-02-09 DIAGNOSIS — Z800 Family history of malignant neoplasm of digestive organs: Secondary | ICD-10-CM

## 2024-02-09 DIAGNOSIS — Z1211 Encounter for screening for malignant neoplasm of colon: Secondary | ICD-10-CM | POA: Diagnosis not present

## 2024-02-09 DIAGNOSIS — M549 Dorsalgia, unspecified: Secondary | ICD-10-CM | POA: Insufficient documentation

## 2024-02-09 DIAGNOSIS — I10 Essential (primary) hypertension: Secondary | ICD-10-CM | POA: Diagnosis not present

## 2024-02-09 DIAGNOSIS — J189 Pneumonia, unspecified organism: Secondary | ICD-10-CM

## 2024-02-09 DIAGNOSIS — Z87891 Personal history of nicotine dependence: Secondary | ICD-10-CM | POA: Diagnosis not present

## 2024-02-09 DIAGNOSIS — Z09 Encounter for follow-up examination after completed treatment for conditions other than malignant neoplasm: Secondary | ICD-10-CM | POA: Diagnosis present

## 2024-02-09 DIAGNOSIS — M2550 Pain in unspecified joint: Secondary | ICD-10-CM | POA: Diagnosis not present

## 2024-02-09 DIAGNOSIS — K5730 Diverticulosis of large intestine without perforation or abscess without bleeding: Secondary | ICD-10-CM

## 2024-02-09 DIAGNOSIS — E0590 Thyrotoxicosis, unspecified without thyrotoxic crisis or storm: Secondary | ICD-10-CM | POA: Insufficient documentation

## 2024-02-09 HISTORY — PX: COLONOSCOPY: SHX5424

## 2024-02-09 SURGERY — COLONOSCOPY
Anesthesia: General

## 2024-02-09 MED ORDER — SODIUM CHLORIDE 0.9% FLUSH: 3.0000 mL | INTRAVENOUS | Status: DC | PRN

## 2024-02-09 MED ORDER — PROPOFOL 10 MG/ML IV BOLUS
INTRAVENOUS | Status: DC | PRN
  Administered 2024-02-09: 50 mg via INTRAVENOUS
  Administered 2024-02-09: 125 ug/kg/min via INTRAVENOUS
  Administered 2024-02-09: 30 mg via INTRAVENOUS
  Administered 2024-02-09: 180 mg via INTRAVENOUS

## 2024-02-09 MED ORDER — SODIUM CHLORIDE 0.9% FLUSH: 3.0000 mL | Freq: Two times a day (BID) | INTRAVENOUS | Status: DC

## 2024-02-09 MED ORDER — LACTATED RINGERS IV SOLN: INTRAVENOUS | Status: DC | PRN

## 2024-02-09 MED ORDER — PHENYLEPHRINE 80 MCG/ML (10ML) SYRINGE FOR IV PUSH (FOR BLOOD PRESSURE SUPPORT)
PREFILLED_SYRINGE | INTRAVENOUS | Status: DC | PRN
Start: 2024-02-09 — End: 2024-02-09
  Administered 2024-02-09 (×2): 80 ug via INTRAVENOUS

## 2024-02-09 NOTE — Op Note (Signed)
 [facility] Patient Name: Jasmine Potter Procedure Date: 02/09/2024 7:01 AM MRN: [id number] Date of Birth: [birth year] Attending MD: Franky Macho , MD, [id number] CSN: [id number] Age: 58 Admit Type: Outpatient Procedure:                Colonoscopy Indications:              Follow-up for history of rectal polyps Providers:                Franky Macho, MD, Francoise Ceo RN, RN, Lennice Sites                            Technician, Technician Referring MD:              Medicines:                Propofol per Anesthesia Complications:            No immediate complications. Estimated Blood Loss:     Estimated blood loss: none. Procedure:                Pre-Anesthesia Assessment:                           - Prior to the procedure, a History and Physical                            was performed, and patient medications and                            allergies were reviewed. The patient is competent.                            The risks and benefits of the procedure and the                            sedation options and risks were discussed with the                            patient. All questions were answered and informed                            consent was obtained. Patient identification and                            proposed procedure were verified by the physician,                            the nurse, the anesthesiologist, the anesthetist                            and the technician in the procedure room. Mental                            Status Examination: alert and oriented. Airway  Examination: normal oropharyngeal airway and neck                            mobility. Respiratory Examination: clear to                            auscultation. CV Examination: RRR, no murmurs, no                            S3 or S4. Prophylactic Antibiotics: The patient                            does not require prophylactic antibiotics. Prior                             Anticoagulants: The patient has taken no                            anticoagulant or antiplatelet agents. ASA Grade                            Assessment: II - A patient with mild systemic                            disease. After reviewing the risks and benefits,                            the patient was deemed in satisfactory condition to                            undergo the procedure. The anesthesia plan was to                            use deep sedation / analgesia. Immediately prior to                            administration of medications, the patient was                            re-assessed for adequacy to receive sedatives. The                            heart rate, respiratory rate, oxygen saturations,                            blood pressure, adequacy of pulmonary ventilation,                            and response to care were monitored throughout the                            procedure. The physical status of the patient was  re-assessed after the procedure.                           After obtaining informed consent, the colonoscope                            was passed under direct vision. Throughout the                            procedure, the patient's blood pressure, pulse, and                            oxygen saturations were monitored continuously. The                            [id number]) scope was introduced through the                            anus and advanced to the the cecum, identified by                            the appendiceal orifice, ileocecal valve and                            palpation. No anatomical landmarks were                            photographed. The entire colon was well visualized.                            The colonoscopy was somewhat difficult due to a                            tortuous colon. The patient tolerated the procedure                            well. The total duration of the  procedure was 19                            minutes. Scope In: 7:30:14 AM Scope Out: 7:49:29 AM Scope Withdrawal Time: 0 hours 4 minutes 37 seconds  Total Procedure Duration: 0 hours 19 minutes 15 seconds  Findings:      Multiple small-mouthed diverticula were found in the sigmoid colon.       Estimated blood loss: none.      The exam was otherwise without abnormality.      The perianal and digital rectal examinations were normal. Impression:               - Diverticulosis in the sigmoid colon.                           - The examination was otherwise normal.                           - No specimens collected. Moderate  Sedation:      Moderate (conscious) sedation was administered by the nurse and       supervised by the endoscopist. The patient's oxygen saturation, heart       rate, blood pressure and response to care were monitored. Recommendation:           - Written discharge instructions were provided to                            the patient.                           - The signs and symptoms of potential delayed                            complications were discussed with the patient.                           - Patient has a contact number available for                            emergencies.                           - Return to normal activities tomorrow.                           - Resume previous diet.                           - Continue present medications.                           - Repeat colonoscopy in 5 years for surveillance of                            multiple polyps. Procedure Code(s):        --- Professional ---                           [phone], Colonoscopy, flexible; diagnostic, including                            collection of specimen(s) by brushing or washing,                            when performed (separate procedure) Diagnosis Code(s):        --- Professional ---                           K57.30, Diverticulosis of large intestine without                             perforation or abscess without bleeding CPT copyright 2022 American [employer] Association. All rights reserved. The codes documented in this report are preliminary and upon coder review may  be revised to meet current compliance requirements. Franky Macho, MD Franky Macho, MD 02/09/2024 7:55:08  AM This report has been signed electronically. Number of Addenda: 0

## 2024-02-09 NOTE — Transfer of Care (Signed)
 Immediate Anesthesia Transfer of Care Note  Patient: Jasmine Potter  Procedure(s) Performed: COLONOSCOPY  Patient Location: Endoscopy Unit  Anesthesia Type:General  Level of Consciousness: drowsy and patient cooperative  Airway & Oxygen Therapy: Patient Spontanous Breathing  Post-op Assessment: Report given to RN and Post -op Vital signs reviewed and stable  Post vital signs: Reviewed and stable  Last Vitals:  Vitals Value Taken Time  BP 85/49 02/09/24 0755  Temp 36.5 C 02/09/24 0755  Pulse 81 02/09/24 0755  Resp 20 02/09/24 0755  SpO2 97 % 02/09/24 0755    Last Pain:  Vitals:   02/09/24 0755  TempSrc: Oral  PainSc:       Patients Stated Pain Goal: 6 (02/09/24 0702)  Complications: No notable events documented.

## 2024-02-09 NOTE — Interval H&P Note (Signed)
 History and Physical Interval Note:  02/09/2024 7:20 AM  Jasmine Potter  has presented today for surgery, with the diagnosis of PERSONAL HISTORY COLON POLYPS FAMILY HISTORY COLON CANCER.  The various methods of treatment have been discussed with the patient and family. After consideration of risks, benefits and other options for treatment, the patient has consented to  Procedure(s) with comments: COLONOSCOPY (N/A) - W/ PROPOFOL as a surgical intervention.  The patient's history has been reviewed, patient examined, no change in status, stable for surgery.  I have reviewed the patient's chart and labs.  Questions were answered to the patient's satisfaction.     Franky Macho

## 2024-02-09 NOTE — Anesthesia Postprocedure Evaluation (Signed)
 Anesthesia Post Note  Patient: Jasmine Potter  Procedure(s) Performed: COLONOSCOPY  Patient location during evaluation: PACU Anesthesia Type: General Level of consciousness: awake and alert Pain management: pain level controlled Vital Signs Assessment: post-procedure vital signs reviewed and stable Respiratory status: spontaneous breathing, nonlabored ventilation, respiratory function stable and patient connected to nasal cannula oxygen Cardiovascular status: blood pressure returned to baseline and stable Postop Assessment: no apparent nausea or vomiting Anesthetic complications: no   There were no known notable events for this encounter.   Last Vitals:  Vitals:   02/09/24 0755 02/09/24 0800  BP: (!) 85/49 139/79  Pulse: 81 79  Resp: 20 17  Temp: 36.5 C   SpO2: 97% 98%    Last Pain:  Vitals:   02/09/24 0800  TempSrc:   PainSc: 0-No pain                 Dann Ventress L Lalanya Rufener

## 2024-02-09 NOTE — Anesthesia Procedure Notes (Signed)
 Date/Time: 02/09/2024 7:25 AM  Performed by: Franco Nones, CRNAPre-anesthesia Checklist: Patient identified, Emergency Drugs available, Suction available, Timeout performed and Patient being monitored Patient Re-evaluated:Patient Re-evaluated prior to induction Oxygen Delivery Method: Nasal Cannula

## 2024-02-10 ENCOUNTER — Encounter (HOSPITAL_COMMUNITY): Payer: Self-pay | Admitting: General Surgery

## 2024-03-02 ENCOUNTER — Ambulatory Visit: Payer: Medicaid Other | Admitting: Vascular Surgery

## 2024-03-02 ENCOUNTER — Ambulatory Visit (HOSPITAL_COMMUNITY): Payer: Medicaid Other

## 2024-03-10 ENCOUNTER — Encounter (HOSPITAL_COMMUNITY): Payer: Self-pay | Admitting: Internal Medicine

## 2024-03-10 ENCOUNTER — Other Ambulatory Visit (HOSPITAL_COMMUNITY): Payer: Self-pay | Admitting: Internal Medicine

## 2024-03-10 DIAGNOSIS — E2839 Other primary ovarian failure: Secondary | ICD-10-CM

## 2024-03-29 ENCOUNTER — Other Ambulatory Visit (HOSPITAL_COMMUNITY)

## 2024-03-30 ENCOUNTER — Ambulatory Visit (HOSPITAL_COMMUNITY)
Admission: RE | Admit: 2024-03-30 | Discharge: 2024-03-30 | Disposition: A | Discharge status: Home / Self Care | Source: Ambulatory Visit | Attending: Internal Medicine | Admitting: Internal Medicine

## 2024-03-30 DIAGNOSIS — E2839 Other primary ovarian failure: Secondary | ICD-10-CM | POA: Insufficient documentation

## 2024-06-29 ENCOUNTER — Ambulatory Visit (HOSPITAL_COMMUNITY)

## 2024-06-29 ENCOUNTER — Ambulatory Visit: Admitting: Vascular Surgery

## 2024-08-24 ENCOUNTER — Encounter (INDEPENDENT_AMBULATORY_CARE_PROVIDER_SITE_OTHER): Payer: Self-pay | Admitting: Gastroenterology

## 2024-09-15 ENCOUNTER — Other Ambulatory Visit (HOSPITAL_COMMUNITY): Payer: Self-pay | Admitting: Nurse Practitioner

## 2024-09-15 ENCOUNTER — Ambulatory Visit (HOSPITAL_COMMUNITY)
Admission: RE | Admit: 2024-09-15 | Discharge: 2024-09-15 | Disposition: A | Discharge status: Home / Self Care | Source: Ambulatory Visit | Attending: Nurse Practitioner | Admitting: Nurse Practitioner

## 2024-09-15 DIAGNOSIS — M25562 Pain in left knee: Secondary | ICD-10-CM | POA: Insufficient documentation

## 2024-09-15 DIAGNOSIS — M25511 Pain in right shoulder: Secondary | ICD-10-CM | POA: Insufficient documentation

## 2024-09-15 DIAGNOSIS — M25561 Pain in right knee: Secondary | ICD-10-CM | POA: Insufficient documentation

## 2024-09-15 DIAGNOSIS — M5451 Vertebrogenic low back pain: Secondary | ICD-10-CM | POA: Diagnosis present

## 2024-09-15 DIAGNOSIS — M25512 Pain in left shoulder: Secondary | ICD-10-CM

## 2024-09-28 ENCOUNTER — Ambulatory Visit: Admitting: Vascular Surgery

## 2024-09-28 ENCOUNTER — Encounter (HOSPITAL_COMMUNITY)

## 2024-10-05 ENCOUNTER — Ambulatory Visit: Admitting: Vascular Surgery

## 2024-10-05 ENCOUNTER — Ambulatory Visit (HOSPITAL_COMMUNITY)

## 2024-10-13 ENCOUNTER — Other Ambulatory Visit: Payer: Self-pay | Admitting: Vascular Surgery

## 2024-10-13 DIAGNOSIS — I8393 Asymptomatic varicose veins of bilateral lower extremities: Secondary | ICD-10-CM

## 2024-10-13 DIAGNOSIS — I872 Venous insufficiency (chronic) (peripheral): Secondary | ICD-10-CM

## 2024-11-29 ENCOUNTER — Ambulatory Visit (HOSPITAL_COMMUNITY): Admission: RE | Admit: 2024-11-29 | Source: Ambulatory Visit

## 2024-11-29 NOTE — Progress Notes (Unsigned)
 "        [facility]           OF [place]  History and Physical   Charolett R Narula is a 59 y.o. female who presents with ***  ***  The pt does *** have hx of previous venous procedures. The patient has *** history of DVT. Pt does *** history of varicose vein.   Pt does *** history of skin changes in lower legs.   There is *** family history of venous disorders.   The patient has *** used compression stockings in the past.    The pt is not on a statin for cholesterol management.  The pt is on a daily aspirin .   Other AC:  none The pt is on BB for hypertension.   The pt is not on medication for diabetes.   Tobacco hx:  former  Pt does *** have family hx of AAA.  Past Medical History:  Diagnosis Date   ADD (attention deficit disorder)    Back pain    Chronic back pain    Depression    Hypertension    OA (osteoarthritis)    Pneumonia    Seizure (HCC)    followed up with nreuology...result of low potassium.SABRA no further work up needed   Spondylosis     Past Surgical History:  Procedure Laterality Date   APPENDECTOMY     CATARACT EXTRACTION W/ INTRAOCULAR LENS IMPLANT     left eye   CESAREAN SECTION  1991   CHOLECYSTECTOMY     COLONOSCOPY N/A 02/09/2024   Procedure: COLONOSCOPY;  Surgeon: Mavis Anes, MD;  Location: [facility];  Service: Gastroenterology;  Laterality: N/A;  W/ PROPOFOL    COLONOSCOPY W/ BIOPSIES AND POLYPECTOMY     COLONOSCOPY WITH PROPOFOL  N/A 02/17/2023   Procedure: COLONOSCOPY WITH PROPOFOL ;  Surgeon: Eartha Angelia Sieving, MD;  Location: [facility];  Service: Gastroenterology;  Laterality: N/A;  1:30pm;ASA 3   HEMORRHOID SURGERY     INTRAOCULAR LENS REMOVAL Left 07/16/2017   Procedure: REMOVAL OF INTRAOCULAR LENS;  Surgeon: Tobie Baptist, MD;  Location: [facility] OR;  Service: Ophthalmology;  Laterality: Left;   LASER PHOTO ABLATION Left 07/16/2017   Procedure: LASER PHOTO ABLATION;  Surgeon: Tobie Baptist, MD;  Location:  [facility] OR;  Service: Ophthalmology;  Laterality: Left;   PARS PLANA VITRECTOMY Left 07/16/2017   Procedure: PARS PLANA VITRECTOMY WITH 25 GAUGE WITH PLACEMENT OF INTRAOCULAR LENS;  Surgeon: Tobie Baptist, MD;  Location: [facility] OR;  Service: Ophthalmology;  Laterality: Left;   POLYPECTOMY  02/17/2023   Procedure: POLYPECTOMY;  Surgeon: Eartha Angelia, Sieving, MD;  Location: [facility];  Service: Gastroenterology;;   TUBAL LIGATION      Social History   Socioeconomic History   Marital status: Divorced    Spouse name: Not on file   Number of children: Not on file   Years of education: Not on file   Highest education level: Not on file  Occupational History   Not on file  Tobacco Use   Smoking status: Former    Current packs/day: 0.00    Types: Cigarettes    Quit date: 2014    Years since quitting: 12.0   Smokeless tobacco: Never   Tobacco comments:    quit smoking cigarettes in 2014  Vaping Use   Vaping status: Every Day   Substances: Nicotine  Substance and Sexual Activity   Alcohol use: No   Drug use: No   Sexual  activity: Not Currently    Birth control/protection: Surgical, Post-menopausal    Comment: tubal  Other Topics Concern   Not on file  Social History Narrative   ** Merged History Encounter **       Social Drivers of Health   Tobacco Use: Low Risk (02/22/2024)   Received from [facility]   Patient History    Passive Exposure: Not on file    Smokeless Tobacco Use: Never    Smoking Tobacco Use: Never  Recent Concern: Tobacco Use - Medium Risk (02/09/2024)   Patient History    Smoking Tobacco Use: Former    Smokeless Tobacco Use: Never    Passive Exposure: Not on [place]uary Strain: Not on file  Food Insecurity: Not on file  Transportation Needs: Not on file  Physical Activity: Not on file  Stress: Not on file  Social Connections: Not on file  Intimate Partner Violence: Not on file  Depression (PHQ2-9): Not on file  Alcohol Screen: Not on  file  Housing: Not on file  Utilities: Not on file  Health Literacy: Not on file    *** Family History  Problem Relation Age of Onset   Breast cancer Mother    Heart attack Father    CAD Father    Leukemia Sister    Breast cancer Sister    Colon cancer Brother    Liver cancer Brother    Colon cancer Paternal Grandmother    Seizures Cousin    Spina bifida Cousin     Current Outpatient Medications  Medication Sig Dispense Refill   ALPRAZolam (XANAX) 1 MG tablet Take 1 mg by mouth daily as needed for anxiety.     amphetamine-dextroamphetamine (ADDERALL) 20 MG tablet Take 20 mg by mouth daily at 6 (six) AM.     aspirin  EC 81 MG tablet Take 81 mg by mouth at bedtime.     atenolol  (TENORMIN ) 50 MG tablet Take 50 mg by mouth in the morning.     chlorthalidone  (HYGROTON ) 25 MG tablet Take 25 mg by mouth in the morning.     Coenzyme Q10 (COQ10 PO) Take 1 capsule by mouth in the morning.     Cyanocobalamin  (VITAMIN B-12 PO) Take 1 tablet by mouth in the morning.     diclofenac Sodium (VOLTAREN ARTHRITIS PAIN) 1 % GEL Apply 2 g topically 4 (four) times daily as needed (pain).     FLUoxetine (PROZAC) 20 MG capsule Take 60 mg by mouth in the morning.     Garlic (GARLIQUE PO) Take 1 capsule by mouth every evening.     Omega-3 Fatty Acids (FISH OIL PO) Take 1 capsule by mouth in the morning and at bedtime.     OVER THE COUNTER MEDICATION Take 2 tablets by mouth in the morning. Reishi mushroom     oxyCODONE (ROXICODONE) 15 MG immediate release tablet Take 15 mg by mouth 2 (two) times daily as needed for pain.     Polyethyl Glycol-Propyl Glycol (LUBRICANT EYE DROPS) 0.4-0.3 % SOLN Place 1-2 drops into both eyes 3 (three) times daily as needed (dry/irritated eyes.).     potassium chloride  SA (KLOR-CON  M) 20 MEQ tablet Take 2 tablets (40 mEq total) by mouth daily for 3 days. 6 tablet 0   Semaglutide,0.25 or 0.5MG /DOS, (OZEMPIC, 0.25 OR 0.5 MG/DOSE,) 2 MG/1.5ML SOPN Inject into the skin.      TURMERIC CURCUMIN PO Take 3 capsules by mouth at bedtime.     No current facility-administered  medications for this visit.    Allergies[1]  REVIEW OF SYSTEMS:  *** [X]  denotes positive finding, [ ]  denotes negative finding Cardiac  Comments:  Chest pain or chest pressure:    Shortness of breath upon exertion:    Short of breath when lying flat:    Irregular heart rhythm:        Vascular    Pain in calf, thigh, or hip brought on by ambulation:    Pain in feet at night that wakes you up from your sleep:     Blood clot in your veins:    Leg swelling:  x       Pulmonary    Oxygen at home:    Productive cough:     Wheezing:         Neurologic    Sudden weakness in arms or legs:     Sudden numbness in arms or legs:     Sudden onset of difficulty speaking or slurred speech:    Temporary loss of vision in one eye:     Problems with dizziness:         Gastrointestinal    Blood in stool:     Vomited blood:         Genitourinary    Burning when urinating:     Blood in urine:        Psychiatric    Major depression:         Hematologic    Bleeding problems:    Problems with blood clotting too easily:        Skin    Rashes or ulcers:        Constitutional    Fever or chills:      PHYSICAL EXAMINATION:  ***  General:  WDWN in NAD; vital signs documented above Gait: Not observed HENT: WNL, normocephalic Pulmonary: normal non-labored breathing without wheezing Cardiac: {Desc; regular/irreg:14544} HR; {With/Without:20273} carotid bruit*** Abdomen: soft, NT, aortic pulse is *** palpable Skin: {With/Without:20273} rashes Vascular Exam/Pulses:  Right Left  Radial {Exam; arterial pulse strength 0-4:30167} {Exam; arterial pulse strength 0-4:30167}  DP {Exam; arterial pulse strength 0-4:30167} {Exam; arterial pulse strength 0-4:30167}  PT {Exam; arterial pulse strength 0-4:30167} {Exam; arterial pulse strength 0-4:30167}   Extremities: ***  Neurologic: A&O X 3;   moving all extremities equally Psychiatric:  The pt has {Desc; normal/abnormal:11317::Normal} affect.   Non-Invasive Vascular Imaging:   Venous duplex on ***: ***    Sheranda R Redfield is a 59 y.o. female who presents with: ***    -pt has *** pedal pulses -in the *** lower extremity, the pt does *** have evidence of DVT.  Pt does ***have venous reflux *** -discussed with pt about wearing *** high *** mmHg compression stockings and pt was measured for these today.   *** -discussed the importance of leg elevation and how to elevate properly - pt is advised to elevate their legs and a diagram is given to them to demonstrate for pt to lay flat on their back with knees elevated and slightly bent with their feet higher than their knees, which puts their feet higher than their heart for 15 minutes per day.  If pt cannot lay flat, advised to lay as flat as possible.  -pt is advised to continue as much walking as possible and avoid sitting or standing for long periods of time.  -discussed importance of maintaining a healthy weight and exercise and that water  aerobics would also be beneficial.  -handout with  recommendations given -pt will f/u ***   Lucie Apt, [facility] [phone]  Clinic MD:  Pearline on call MD    [1] No Known Allergies  "

## 2024-11-30 ENCOUNTER — Ambulatory Visit

## 2024-12-06 ENCOUNTER — Ambulatory Visit

## 2024-12-07 ENCOUNTER — Ambulatory Visit: Admitting: Vascular Surgery

## 2024-12-07 ENCOUNTER — Ambulatory Visit (HOSPITAL_COMMUNITY)

## 2025-03-15 ENCOUNTER — Ambulatory Visit (HOSPITAL_COMMUNITY)

## 2025-03-15 ENCOUNTER — Ambulatory Visit: Admitting: Vascular Surgery
